# Patient Record
Sex: Female | Born: 1960
Health system: Southern US, Community
[De-identification: ages and names within clinical notes are randomized; demographics above are authoritative.]

## PROBLEM LIST (undated history)

## (undated) DIAGNOSIS — N179 Acute kidney failure, unspecified: Secondary | ICD-10-CM

## (undated) DIAGNOSIS — E7800 Pure hypercholesterolemia, unspecified: Secondary | ICD-10-CM

## (undated) DIAGNOSIS — I10 Essential (primary) hypertension: Secondary | ICD-10-CM

## (undated) DIAGNOSIS — M1990 Unspecified osteoarthritis, unspecified site: Secondary | ICD-10-CM

## (undated) DIAGNOSIS — G009 Bacterial meningitis, unspecified: Secondary | ICD-10-CM

## (undated) HISTORY — DX: Morbid (severe) obesity due to excess calories: E66.01

## (undated) HISTORY — PX: NO PAST SURGERIES: SHX2092

## (undated) HISTORY — DX: Unspecified osteoarthritis, unspecified site: M19.90

## (undated) HISTORY — PX: COLONOSCOPY: SHX174

## (undated) HISTORY — DX: Essential (primary) hypertension: I10

---
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
## 2001-02-05 ENCOUNTER — Other Ambulatory Visit: Admission: RE | Admit: 2001-02-05 | Discharge: 2001-02-05 | Payer: Self-pay | Admitting: Family Medicine

## 2001-02-13 ENCOUNTER — Ambulatory Visit (HOSPITAL_COMMUNITY): Admission: RE | Admit: 2001-02-13 | Discharge: 2001-02-13 | Payer: Self-pay | Admitting: Family Medicine

## 2001-02-13 ENCOUNTER — Encounter: Payer: Self-pay | Admitting: Family Medicine

## 2007-06-29 ENCOUNTER — Ambulatory Visit (HOSPITAL_COMMUNITY): Admission: RE | Admit: 2007-06-29 | Discharge: 2007-06-29 | Payer: Self-pay | Admitting: Internal Medicine

## 2007-11-19 ENCOUNTER — Ambulatory Visit (HOSPITAL_COMMUNITY): Admission: RE | Admit: 2007-11-19 | Discharge: 2007-11-19 | Payer: Self-pay | Admitting: Internal Medicine

## 2007-12-07 ENCOUNTER — Other Ambulatory Visit: Admission: RE | Admit: 2007-12-07 | Discharge: 2007-12-07 | Payer: Self-pay | Admitting: Obstetrics and Gynecology

## 2008-05-25 ENCOUNTER — Emergency Department (HOSPITAL_COMMUNITY): Admission: EM | Admit: 2008-05-25 | Discharge: 2008-05-25 | Payer: Self-pay | Admitting: Emergency Medicine

## 2009-09-18 ENCOUNTER — Emergency Department (HOSPITAL_COMMUNITY): Admission: EM | Admit: 2009-09-18 | Discharge: 2009-09-18 | Payer: Self-pay | Admitting: Emergency Medicine

## 2010-01-29 ENCOUNTER — Emergency Department (HOSPITAL_COMMUNITY): Admission: EM | Admit: 2010-01-29 | Discharge: 2010-01-29 | Payer: Self-pay | Admitting: Emergency Medicine

## 2010-12-11 LAB — RAPID STREP SCREEN (MED CTR MEBANE ONLY): Streptococcus, Group A Screen (Direct): NEGATIVE

## 2011-06-26 LAB — URINALYSIS, ROUTINE W REFLEX MICROSCOPIC
Nitrite: NEGATIVE
Protein, ur: NEGATIVE
Urobilinogen, UA: 0.2

## 2011-06-26 LAB — URINE MICROSCOPIC-ADD ON

## 2013-03-02 ENCOUNTER — Ambulatory Visit (INDEPENDENT_AMBULATORY_CARE_PROVIDER_SITE_OTHER): Payer: Medicaid Other | Admitting: Advanced Practice Midwife

## 2013-03-02 ENCOUNTER — Encounter: Payer: Self-pay | Admitting: Advanced Practice Midwife

## 2013-03-02 VITALS — BP 120/88 | Ht 65.0 in | Wt 252.0 lb

## 2013-03-02 DIAGNOSIS — Z3049 Encounter for surveillance of other contraceptives: Secondary | ICD-10-CM

## 2013-03-02 DIAGNOSIS — Z3200 Encounter for pregnancy test, result unknown: Secondary | ICD-10-CM

## 2013-03-02 DIAGNOSIS — Z30432 Encounter for removal of intrauterine contraceptive device: Secondary | ICD-10-CM

## 2013-03-02 NOTE — Patient Instructions (Signed)
Follow up in 6 weeks for [facility] level to see if you have completed menopause.  Use condoms in the meantime.

## 2013-03-02 NOTE — Progress Notes (Signed)
Here for IUD removal.  She had the Mirena IUD placed May 2009 and would like it removed . Her plans for future contraception are to hope that she has gone through menopause.  She will use condoms for 6 weeks then check a FSH level.  She is aware that she should have a withdrwal bleed regardless of whether she has gone through menopause or not.  She has been amenorreic for 5 years  Review of Systems   Constitutional: Negative for fever and chills Eyes: Negative for visual disturbances Respiratory: Negative for shortness of breath, dyspnea Cardiovascular: Negative for chest pain or palpitations  Gastrointestinal: Negative for vomiting, diarrhea and constipation Genitourinary: Negative for dysuria and urgency Musculoskeletal: Negative for back pain, joint pain, myalgias  Neurological: Negative for dizziness and headaches    A graves speculum was placed, and the strings were visible.  They were grasped with a curved Tresa Endo and the IUD easily removed.  Pt given IUD removal f/u instructions.

## 2013-04-13 ENCOUNTER — Ambulatory Visit: Payer: Self-pay | Admitting: Advanced Practice Midwife

## 2013-04-13 ENCOUNTER — Other Ambulatory Visit: Payer: Medicaid Other

## 2013-04-13 ENCOUNTER — Other Ambulatory Visit: Payer: Self-pay

## 2013-04-13 DIAGNOSIS — N912 Amenorrhea, unspecified: Secondary | ICD-10-CM

## 2013-04-16 ENCOUNTER — Telehealth: Payer: Self-pay | Admitting: Adult Health

## 2013-04-16 NOTE — Telephone Encounter (Signed)
Spoke with pt. FSH was 23.1. Per Drenda Freeze, not postmenopausal. Pt was planning to reinsert IUD but thinks her body feels better with IUD out. Advised pt to schedule an appt to discuss. Pt wants to check work schedule and call us back. JSY

## 2013-10-29 ENCOUNTER — Emergency Department (HOSPITAL_COMMUNITY)
Admission: EM | Admit: 2013-10-29 | Discharge: 2013-10-29 | Disposition: A | Discharge status: Home / Self Care | Payer: BC Managed Care – PPO | Attending: Emergency Medicine | Admitting: Emergency Medicine

## 2013-10-29 ENCOUNTER — Encounter (HOSPITAL_COMMUNITY): Payer: Self-pay | Admitting: Emergency Medicine

## 2013-10-29 ENCOUNTER — Emergency Department (HOSPITAL_COMMUNITY): Payer: BC Managed Care – PPO

## 2013-10-29 DIAGNOSIS — Z79899 Other long term (current) drug therapy: Secondary | ICD-10-CM | POA: Insufficient documentation

## 2013-10-29 DIAGNOSIS — R1110 Vomiting, unspecified: Secondary | ICD-10-CM

## 2013-10-29 DIAGNOSIS — R519 Headache, unspecified: Secondary | ICD-10-CM

## 2013-10-29 DIAGNOSIS — R630 Anorexia: Secondary | ICD-10-CM | POA: Insufficient documentation

## 2013-10-29 DIAGNOSIS — R509 Fever, unspecified: Secondary | ICD-10-CM | POA: Insufficient documentation

## 2013-10-29 DIAGNOSIS — R51 Headache: Secondary | ICD-10-CM | POA: Insufficient documentation

## 2013-10-29 DIAGNOSIS — IMO0002 Reserved for concepts with insufficient information to code with codable children: Secondary | ICD-10-CM | POA: Insufficient documentation

## 2013-10-29 DIAGNOSIS — I10 Essential (primary) hypertension: Secondary | ICD-10-CM | POA: Insufficient documentation

## 2013-10-29 DIAGNOSIS — J3489 Other specified disorders of nose and nasal sinuses: Secondary | ICD-10-CM | POA: Insufficient documentation

## 2013-10-29 DIAGNOSIS — R42 Dizziness and giddiness: Secondary | ICD-10-CM | POA: Insufficient documentation

## 2013-10-29 MED ORDER — ONDANSETRON 4 MG PO TBDP: 4.0000 mg | ORAL_TABLET | Freq: Three times a day (TID) | ORAL | Status: DC | PRN

## 2013-10-29 MED ORDER — HYDROCODONE-ACETAMINOPHEN 5-325 MG PO TABS
1.0000 | ORAL_TABLET | Freq: Once | ORAL | Status: AC
  Administered 2013-10-29: 1 via ORAL
  Filled 2013-10-29: qty 1

## 2013-10-29 MED ORDER — ONDANSETRON 4 MG PO TBDP
4.0000 mg | ORAL_TABLET | Freq: Once | ORAL | Status: AC
  Administered 2013-10-29: 4 mg via ORAL
  Filled 2013-10-29: qty 1

## 2013-10-29 MED ORDER — HYDROCODONE-ACETAMINOPHEN 5-325 MG PO TABS: 1.0000 | ORAL_TABLET | Freq: Four times a day (QID) | ORAL | Status: DC | PRN

## 2013-10-29 NOTE — ED Notes (Signed)
Patient reports congestion and pressure in head. Reports dizziness and feels lightheaded with ambulation.

## 2013-10-29 NOTE — ED Provider Notes (Signed)
CSN: [id number]     Arrival date & time 10/29/13  5859 History  This chart was scribed for Mervin Kung, MD by Ludger Nutting, ED Scribe. This patient was seen in room APA05/APA05 and the patient's care was started 8:28 PM.    Chief Complaint  Patient presents with  . Nasal Congestion  . Dizziness    The history is provided by the patient. No language interpreter was used.    HPI Comments: Alison Elliott is a 53 y.o. female who presents to the Emergency Department complaining of constant, gradually worsening headache and facial pressure that began 2 days ago. She also reports having decreased appetite, subjective fever, chills, and a few episodes of vomiting. She also reports having lightheadedness when leaning forward. She was seen at [facility] center yesterday and was given a nasal spray and coricidin. She denies rhinorrhea, sore throat, diarrhea.   Past Medical History  Diagnosis Date  . Hypertension    Past Surgical History  Procedure Laterality Date  . No past surgeries     Family History  Problem Relation Age of Onset  . Cancer Father     lung  . Emphysema Father   . Hypertension Mother   . Diabetes Mother   . Dementia Mother   . Arthritis Mother    History  Substance Use Topics  . Smoking status: Never Smoker   . Smokeless tobacco: Not on file  . Alcohol Use: No   OB History   Grav Para Term Preterm Abortions TAB SAB Ect Mult Living   1 1             Review of Systems  Constitutional: Positive for fever, chills and appetite change (decreased).  HENT: Negative for rhinorrhea and sore throat.   Eyes: Negative for visual disturbance.  Respiratory: Negative for cough and shortness of breath.   Cardiovascular: Negative for chest pain and leg swelling.  Gastrointestinal: Positive for vomiting. Negative for abdominal pain and diarrhea.  Genitourinary: Negative for dysuria.  Musculoskeletal: Negative for back pain and neck pain.  Skin: Negative for  rash.  Neurological: Positive for light-headedness and headaches. Negative for dizziness.  Hematological: Does not bruise/bleed easily.  Psychiatric/Behavioral: Negative for confusion.    Allergies  Review of patient's allergies indicates no known allergies.  Home Medications   Current Outpatient Rx  Name  Route  Sig  Dispense  Refill  . DM-APAP-CPM (CORICIDIN HBP FLU) 15-500-2 MG TABS   Oral   Take 2 tablets by mouth every 6 (six) hours as needed. Cold/flu         . fluticasone (FLONASE) 50 MCG/ACT nasal spray   Each Nare   Place 1 spray into both nostrils 2 (two) times daily.         Marland Kitchen lisinopril-hydrochlorothiazide (PRINZIDE,ZESTORETIC) 10-12.5 MG per tablet   Oral   Take 1 tablet by mouth daily.         Marland Kitchen HYDROcodone-acetaminophen (NORCO/VICODIN) 5-325 MG per tablet   Oral   Take 1-2 tablets by mouth every 6 (six) hours as needed for moderate pain.   14 tablet   0   . ondansetron (ZOFRAN ODT) 4 MG disintegrating tablet   Oral   Take 1 tablet (4 mg total) by mouth every 8 (eight) hours as needed.   12 tablet   1    BP 155/97  Pulse 116  Temp(Src) 98.9 F (37.2 C) (Oral)  Resp 20  Ht 5\' 3"  (1.6 m)  Wt  248 lb (112.492 kg)  BMI 43.94 kg/m2  SpO2 100%  LMP 10/20/2013 Physical Exam  Nursing note and vitals reviewed. Constitutional: She is oriented to person, place, and time. She appears well-developed and well-nourished.  HENT:  Head: Normocephalic and atraumatic.  Cardiovascular: Normal rate, regular rhythm and normal heart sounds.   Pulmonary/Chest: Effort normal and breath sounds normal. No respiratory distress. She has no wheezes. She has no rales. She exhibits no tenderness.  Abdominal: Soft. Bowel sounds are normal. She exhibits no distension. There is no tenderness. There is no rebound and no guarding.  Musculoskeletal: She exhibits no edema.  Neurological: She is alert and oriented to person, place, and time.  Skin: Skin is warm and dry.   Psychiatric: She has a normal mood and affect.    ED Course  Procedures (including critical care time)  DIAGNOSTIC STUDIES: Oxygen Saturation is 100% on RA, normal by my interpretation.    COORDINATION OF CARE: 8:32 PM Discussed treatment plan with pt at bedside and pt agreed to plan.   Labs Review Labs Reviewed - No data to display Imaging Review Ct Head Wo Contrast  10/29/2013   CLINICAL DATA:  53 year old female with congestion, pressure, dizziness, lightheaded, altered mental status. Initial encounter.  EXAM: CT HEAD WITHOUT CONTRAST  CT MAXILLOFACIAL WITHOUT CONTRAST  TECHNIQUE: Multidetector CT imaging of the head and maxillofacial structures were performed using the standard protocol without intravenous contrast. Multiplanar CT image reconstructions of the maxillofacial structures were also generated.  COMPARISON:  None.  FINDINGS: CT HEAD FINDINGS  Mastoids are clear.  No scalp hematoma.  Calvarium intact.  Cerebral volume is normal. Mild patchy cerebral white matter hypodensity, mostly periatrial in location. No suspicious intracranial vascular hyperdensity. No midline shift, ventriculomegaly, mass effect, evidence of mass lesion, intracranial hemorrhage or evidence of cortically based acute infarction. Gray-white matter differentiation is within normal limits throughout the brain.  CT MAXILLOFACIAL FINDINGS  Dysconjugate gaze. Otherwise orbits soft tissues appear within normal limits.  Mandible intact. Only minimal paranasal sinus mucosal thickening. Tympanic cavities are clear. Petrous apex air cells are clear, more numerous on the left. No acute facial bone fracture. Rightward superior and leftward inferior nasal septal deviation.  Visualized nasopharynx, oropharynx, hypopharynx, larynx, sublingual space, submandibular glands, parotid glands, parapharyngeal spaces and retropharyngeal space are within normal limits.  Visible cervical spine remarkable for chronic disc and endplate  degeneration at C4-C5.  IMPRESSION: 1. No acute intracranial abnormality. Mild for age nonspecific white matter changes. 2. Minimal paranasal sinus mucosal thickening. No acute findings identified in the face.   Electronically Signed   By: Lars Pinks M.D.   On: 10/29/2013 21:11   Ct Maxillofacial Wo Cm  10/29/2013   CLINICAL DATA:  53 year old female with congestion, pressure, dizziness, lightheaded, altered mental status. Initial encounter.  EXAM: CT HEAD WITHOUT CONTRAST  CT MAXILLOFACIAL WITHOUT CONTRAST  TECHNIQUE: Multidetector CT imaging of the head and maxillofacial structures were performed using the standard protocol without intravenous contrast. Multiplanar CT image reconstructions of the maxillofacial structures were also generated.  COMPARISON:  None.  FINDINGS: CT HEAD FINDINGS  Mastoids are clear.  No scalp hematoma.  Calvarium intact.  Cerebral volume is normal. Mild patchy cerebral white matter hypodensity, mostly periatrial in location. No suspicious intracranial vascular hyperdensity. No midline shift, ventriculomegaly, mass effect, evidence of mass lesion, intracranial hemorrhage or evidence of cortically based acute infarction. Gray-white matter differentiation is within normal limits throughout the brain.  CT MAXILLOFACIAL FINDINGS  Dysconjugate gaze. Otherwise orbits  soft tissues appear within normal limits.  Mandible intact. Only minimal paranasal sinus mucosal thickening. Tympanic cavities are clear. Petrous apex air cells are clear, more numerous on the left. No acute facial bone fracture. Rightward superior and leftward inferior nasal septal deviation.  Visualized nasopharynx, oropharynx, hypopharynx, larynx, sublingual space, submandibular glands, parotid glands, parapharyngeal spaces and retropharyngeal space are within normal limits.  Visible cervical spine remarkable for chronic disc and endplate degeneration at C4-C5.  IMPRESSION: 1. No acute intracranial abnormality. Mild for age  nonspecific white matter changes. 2. Minimal paranasal sinus mucosal thickening. No acute findings identified in the face.   Electronically Signed   By: Lars Pinks M.D.   On: 10/29/2013 21:11    EKG Interpretation   None       MDM   1. Vomiting   2. Dizziness   3. Headache    Patient clinically seemed as if she may have had sinusitis. But head CT maxillofacial CT negative for that. Suspect with the dizziness feeling lightheaded some nausea and vomiting and headache and this may just be a viral illness. No other symptoms acutely. Patient is nontoxic no acute distress. No fever. Be treated with pain medicine and antinausea medicine. Patient will followup with her doctor if symptoms persist will get an MRI of the brain. Patient will return for any newer worse symptoms.  I personally performed the services described in this documentation, which was scribed in my presence. The recorded information has been reviewed and is accurate.    Mervin Kung, MD 10/29/13 2141

## 2013-10-29 NOTE — Discharge Instructions (Signed)
Suspect a viral illness as the cause of your symptoms. Head CT and sinus CT was negative. Take pain medication and antinausea medicine as directed. Symptoms persist MRI of the brain will be needed. Return for any newer worse symptoms. Plan to make an appointment with Dr. Legrand Rams sometime in the next few days. Work note provided.

## 2013-10-31 ENCOUNTER — Encounter (HOSPITAL_COMMUNITY): Payer: Self-pay | Admitting: Emergency Medicine

## 2013-10-31 ENCOUNTER — Emergency Department (HOSPITAL_COMMUNITY): Payer: BC Managed Care – PPO

## 2013-10-31 ENCOUNTER — Inpatient Hospital Stay (HOSPITAL_COMMUNITY)
Admission: EM | Admit: 2013-10-31 | Discharge: 2013-11-09 | DRG: 094 | Disposition: A | Discharge status: Home / Self Care | Payer: BC Managed Care – PPO | Attending: Internal Medicine | Admitting: Internal Medicine

## 2013-10-31 ENCOUNTER — Inpatient Hospital Stay (HOSPITAL_COMMUNITY): Payer: BC Managed Care – PPO

## 2013-10-31 DIAGNOSIS — N19 Unspecified kidney failure: Secondary | ICD-10-CM

## 2013-10-31 DIAGNOSIS — G009 Bacterial meningitis, unspecified: Principal | ICD-10-CM | POA: Diagnosis present

## 2013-10-31 DIAGNOSIS — E861 Hypovolemia: Secondary | ICD-10-CM | POA: Diagnosis present

## 2013-10-31 DIAGNOSIS — R51 Headache: Secondary | ICD-10-CM

## 2013-10-31 DIAGNOSIS — N179 Acute kidney failure, unspecified: Secondary | ICD-10-CM

## 2013-10-31 DIAGNOSIS — R6510 Systemic inflammatory response syndrome (SIRS) of non-infectious origin without acute organ dysfunction: Secondary | ICD-10-CM

## 2013-10-31 DIAGNOSIS — I10 Essential (primary) hypertension: Secondary | ICD-10-CM | POA: Diagnosis present

## 2013-10-31 DIAGNOSIS — R519 Headache, unspecified: Secondary | ICD-10-CM

## 2013-10-31 DIAGNOSIS — I959 Hypotension, unspecified: Secondary | ICD-10-CM

## 2013-10-31 DIAGNOSIS — G9340 Encephalopathy, unspecified: Secondary | ICD-10-CM | POA: Diagnosis present

## 2013-10-31 DIAGNOSIS — Z8249 Family history of ischemic heart disease and other diseases of the circulatory system: Secondary | ICD-10-CM

## 2013-10-31 DIAGNOSIS — R4182 Altered mental status, unspecified: Secondary | ICD-10-CM

## 2013-10-31 DIAGNOSIS — E860 Dehydration: Secondary | ICD-10-CM | POA: Diagnosis present

## 2013-10-31 DIAGNOSIS — E669 Obesity, unspecified: Secondary | ICD-10-CM | POA: Diagnosis present

## 2013-10-31 DIAGNOSIS — R509 Fever, unspecified: Secondary | ICD-10-CM

## 2013-10-31 DIAGNOSIS — Z833 Family history of diabetes mellitus: Secondary | ICD-10-CM

## 2013-10-31 HISTORY — DX: Acute kidney failure, unspecified: N17.9

## 2013-10-31 LAB — BASIC METABOLIC PANEL
BUN: 27 mg/dL — ABNORMAL HIGH (ref 6–23)
CO2: 23 mEq/L (ref 19–32)
Calcium: 9.2 mg/dL (ref 8.4–10.5)
Chloride: 89 mEq/L — ABNORMAL LOW (ref 96–112)
Creatinine, Ser: 3.19 mg/dL — ABNORMAL HIGH (ref 0.50–1.10)
GFR calc Af Amer: 18 mL/min — ABNORMAL LOW (ref >90)
GFR calc non Af Amer: 16 mL/min — ABNORMAL LOW (ref >90)
Glucose, Bld: 116 mg/dL — ABNORMAL HIGH (ref 70–99)
Potassium: 3.3 mEq/L — ABNORMAL LOW (ref 3.7–5.3)
Sodium: 132 mEq/L — ABNORMAL LOW (ref 137–147)

## 2013-10-31 LAB — HEPATIC FUNCTION PANEL
ALBUMIN: 3.6 g/dL (ref 3.5–5.2)
ALT: 17 U/L (ref 0–35)
AST: 22 U/L (ref 0–37)
Alkaline Phosphatase: 104 U/L (ref 39–117)
Bilirubin, Direct: 0.5 mg/dL — ABNORMAL HIGH (ref 0.0–0.3)
Indirect Bilirubin: 1.6 mg/dL — ABNORMAL HIGH (ref 0.3–0.9)
Total Bilirubin: 2.1 mg/dL — ABNORMAL HIGH (ref 0.3–1.2)
Total Protein: 8 g/dL (ref 6.0–8.3)

## 2013-10-31 LAB — URINALYSIS, ROUTINE W REFLEX MICROSCOPIC
Glucose, UA: NEGATIVE mg/dL
Hgb urine dipstick: NEGATIVE
Ketones, ur: NEGATIVE mg/dL
Leukocytes, UA: NEGATIVE
Nitrite: NEGATIVE
Protein, ur: NEGATIVE mg/dL
Specific Gravity, Urine: 1.02 (ref 1.005–1.030)
Urobilinogen, UA: 2 mg/dL — ABNORMAL HIGH (ref 0.0–1.0)
pH: 6 (ref 5.0–8.0)

## 2013-10-31 LAB — CBC WITH DIFFERENTIAL/PLATELET
Basophils Absolute: 0 10*3/uL (ref 0.0–0.1)
Basophils Relative: 0 % (ref 0–1)
Eosinophils Absolute: 0.1 10*3/uL (ref 0.0–0.7)
Eosinophils Relative: 1 % (ref 0–5)
HCT: 43.9 % (ref 36.0–46.0)
Hemoglobin: 14.4 g/dL (ref 12.0–15.0)
Lymphocytes Relative: 40 % (ref 12–46)
Lymphs Abs: 4.6 10*3/uL — ABNORMAL HIGH (ref 0.7–4.0)
MCH: 27.3 pg (ref 26.0–34.0)
MCHC: 32.8 g/dL (ref 30.0–36.0)
MCV: 83.3 fL (ref 78.0–100.0)
Monocytes Absolute: 1.2 10*3/uL — ABNORMAL HIGH (ref 0.1–1.0)
Monocytes Relative: 10 % (ref 3–12)
Neutro Abs: 5.7 10*3/uL (ref 1.7–7.7)
Neutrophils Relative %: 49 % (ref 43–77)
Platelets: 310 10*3/uL (ref 150–400)
RBC: 5.27 MIL/uL — ABNORMAL HIGH (ref 3.87–5.11)
RDW: 14.2 % (ref 11.5–15.5)
WBC: 11.5 10*3/uL — ABNORMAL HIGH (ref 4.0–10.5)

## 2013-10-31 LAB — RAPID URINE DRUG SCREEN, HOSP PERFORMED
Amphetamines: NOT DETECTED
BARBITURATES: NOT DETECTED
Benzodiazepines: NOT DETECTED
Cocaine: NOT DETECTED
Opiates: POSITIVE — AB
Tetrahydrocannabinol: NOT DETECTED

## 2013-10-31 LAB — INFLUENZA PANEL BY PCR (TYPE A & B)
H1N1FLUPCR: NOT DETECTED
Influenza A By PCR: NEGATIVE
Influenza B By PCR: NEGATIVE

## 2013-10-31 LAB — CG4 I-STAT (LACTIC ACID): Lactic Acid, Venous: 1.74 mmol/L (ref 0.5–2.2)

## 2013-10-31 LAB — GLUCOSE, CAPILLARY: Glucose-Capillary: 116 mg/dL — ABNORMAL HIGH (ref 70–99)

## 2013-10-31 LAB — LACTIC ACID, PLASMA: Lactic Acid, Venous: 1.5 mmol/L (ref 0.5–2.2)

## 2013-10-31 MED ORDER — DEXTROSE 5 % IV SOLN
2.0000 g | INTRAVENOUS | Status: DC
  Filled 2013-10-31: qty 2

## 2013-10-31 MED ORDER — SODIUM CHLORIDE 0.9 % IV BOLUS (SEPSIS)
1000.0000 mL | Freq: Once | INTRAVENOUS | Status: AC
  Administered 2013-10-31: 1000 mL via INTRAVENOUS

## 2013-10-31 MED ORDER — SODIUM CHLORIDE 0.9 % IV BOLUS (SEPSIS): 1000.0000 mL | Freq: Once | INTRAVENOUS | Status: DC

## 2013-10-31 MED ORDER — DEXTROSE 5 % IV SOLN
INTRAVENOUS | Status: AC
  Filled 2013-10-31: qty 2

## 2013-10-31 MED ORDER — SODIUM CHLORIDE 0.9 % IV SOLN
INTRAVENOUS | Status: DC
  Administered 2013-11-01 – 2013-11-06 (×11): via INTRAVENOUS

## 2013-10-31 MED ORDER — ACETAMINOPHEN 650 MG RE SUPP
650.0000 mg | Freq: Once | RECTAL | Status: AC
  Administered 2013-10-31: 650 mg via RECTAL
  Filled 2013-10-31: qty 1

## 2013-10-31 MED ORDER — VANCOMYCIN HCL IN DEXTROSE 1-5 GM/200ML-% IV SOLN
1000.0000 mg | Freq: Once | INTRAVENOUS | Status: AC
  Administered 2013-10-31: 1000 mg via INTRAVENOUS
  Filled 2013-10-31: qty 200

## 2013-10-31 MED ORDER — CHLORHEXIDINE GLUCONATE CLOTH 2 % EX PADS
6.0000 | MEDICATED_PAD | Freq: Every day | CUTANEOUS | Status: DC
  Administered 2013-10-31: 6 via TOPICAL

## 2013-10-31 MED ORDER — DEXTROSE 5 % IV SOLN
2.0000 g | Freq: Once | INTRAVENOUS | Status: AC
  Administered 2013-10-31: 2 g via INTRAVENOUS
  Filled 2013-10-31: qty 2

## 2013-10-31 MED ORDER — SODIUM CHLORIDE 0.9 % IV SOLN
INTRAVENOUS | Status: AC
  Administered 2013-11-01 (×2): via INTRAVENOUS

## 2013-10-31 MED ORDER — PIPERACILLIN-TAZOBACTAM 3.375 G IVPB 30 MIN
3.3750 g | Freq: Once | INTRAVENOUS | Status: DC
  Filled 2013-10-31: qty 50

## 2013-10-31 NOTE — ED Provider Notes (Signed)
CSN: [id number]     Arrival date & time 10/31/13  1541 History  This chart was scribed for Virgel Manifold, MD by Roxan Diesel, ED scribe.  This patient was seen in room APA10/APA10 and the patient's care was started at 3:59 PM.   Chief Complaint  Patient presents with  . Headache    The history is provided by the patient and a relative. No language interpreter was used.    HPI Comments: Alison Elliott is a 53 y.o. female who presents to the Emergency Department complaining of a persistent frontal headache that began 6 days ago, with associated "shuffling" that began 2 days ago and slurred speech that began today.  Pt saw a doctor at [facility] 4 days ago for her headache and was instructed to take some nasal spray and OTC cold medication.  Her HA persisted and she was again seen here, where she received an assuring head CT and was given hydrocodone and anti-emetics to take at home.  Pt's headache has still not resolved.  Sister also states that 2 days ago pt started "shuffling" and was complaining of feeling off-balanced when she stood up.  Today at around 3:30 PM her daughter noticed that her speech was slurred and called EMS.  Pt has also been very fatigued.  Pt denies neck pain, nausea, visual changes, urinary symptoms, vomiting, fever, or cough.  She denies recent falls.  Pt works as a [employer]ar[employer] manager.  She lives at home with her daughter.   Past Medical History  Diagnosis Date  . Hypertension     Past Surgical History  Procedure Laterality Date  . No past surgeries      Family History  Problem Relation Age of Onset  . Cancer Father     lung  . Emphysema Father   . Hypertension Mother   . Diabetes Mother   . Dementia Mother   . Arthritis Mother     History  Substance Use Topics  . Smoking status: Never Smoker   . Smokeless tobacco: Not on file  . Alcohol Use: No    OB History   Grav Para Term Preterm Abortions TAB SAB Ect Mult Living   1 1               Review of Systems  All other systems reviewed and are negative.     Allergies  Review of patient's allergies indicates no known allergies.  Home Medications   Current Outpatient Rx  Name  Route  Sig  Dispense  Refill  . DM-APAP-CPM (CORICIDIN HBP FLU) 15-500-2 MG TABS   Oral   Take 2 tablets by mouth every 6 (six) hours as needed. Cold/flu         . fluticasone (FLONASE) 50 MCG/ACT nasal spray   Each Nare   Place 1 spray into both nostrils 2 (two) times daily.         Marland Kitchen HYDROcodone-acetaminophen (NORCO/VICODIN) 5-325 MG per tablet   Oral   Take 1-2 tablets by mouth every 6 (six) hours as needed for moderate pain.   14 tablet   0   . lisinopril-hydrochlorothiazide (PRINZIDE,ZESTORETIC) 10-12.5 MG per tablet   Oral   Take 1 tablet by mouth daily.         . ondansetron (ZOFRAN ODT) 4 MG disintegrating tablet   Oral   Take 1 tablet (4 mg total) by mouth every 8 (eight) hours as needed.   12 tablet   1  BP 93/46  Pulse 122  Temp(Src) 100 F (37.8 C) (Oral)  Resp 20  Ht 5\' 3"  (1.6 m)  Wt 248 lb (112.492 kg)  BMI 43.94 kg/m2  SpO2 94%  LMP 10/20/2013  Physical Exam  Nursing note and vitals reviewed. Constitutional: She appears well-developed and well-nourished. No distress.  HENT:  Head: Normocephalic and atraumatic.  Eyes: Conjunctivae are normal. Right eye exhibits no discharge. Left eye exhibits no discharge.  Neck: Neck supple.  No nuchal rigidity  Cardiovascular: Regular rhythm and normal heart sounds.  Tachycardia present.  Exam reveals no gallop and no friction rub.   No murmur heard. Pulmonary/Chest: Effort normal and breath sounds normal. No respiratory distress.  Abdominal: Soft. She exhibits no distension. There is no tenderness.  Musculoskeletal: She exhibits no edema and no tenderness.  Neurological:  Drowsy.  Follows simple commands but falls asleep quickly.  Globally weak.  No focal motor deficit.   Skin: Skin is warm and dry.     ED Course  LUMBAR PUNCTURE Date/Time: 10/31/2013 7:00 PM Performed by: Virgel Manifold Authorized by: Virgel Manifold Consent: Verbal consent obtained. written consent obtained. Risks and benefits: risks, benefits and alternatives were discussed Consent given by: patient Required items: required blood products, implants, devices, and special equipment available Patient identity confirmed: verbally with patient, arm band and provided demographic data Time out: Immediately prior to procedure a "time out" was called to verify the correct patient, procedure, equipment, support staff and site/side marked as required. Indications: evaluation for infection and evaluation for altered mental status Anesthesia: local infiltration Local anesthetic: lidocaine 1% without epinephrine Anesthetic total: 5 ml Patient sedated: no Preparation: Patient was prepped and draped in the usual sterile fashion. Lumbar space: L3-L4 interspace Patient's position: left lateral decubitus Needle gauge: 22 Needle type: spinal needle - Quincke tip Needle length: 3.5 in Number of attempts: 2 Post-procedure: adhesive bandage applied Patient tolerance: Patient tolerated the procedure well with no immediate complications. Comments: LP attempted unsuccessfully. No apparent complications.    CRITICAL CARE Performed by: Virgel Manifold  Total critical care time: 35 minutes  Critical care time was exclusive of separately billable procedures and treating other patients. Critical care was necessary to treat or prevent imminent or life-threatening deterioration. Critical care was time spent personally by me on the following activities: development of treatment plan with patient and/or surrogate as well as nursing, discussions with consultants, evaluation of patient's response to treatment, examination of patient, obtaining history from patient or surrogate, ordering and performing treatments and interventions, ordering and  review of laboratory studies, ordering and review of radiographic studies, pulse oximetry and re-evaluation of patient's condition.    (including critical care time)    DIAGNOSTIC STUDIES: Oxygen Saturation is 94% on room air, adequate by my interpretation.    COORDINATION OF CARE: 4:06 PM-Discussed treatment plan which includes lumbar puncture, CXR, bloodwork, UA, and admission with pt's family at bedside and they agreed to plan.     Labs Review Labs Reviewed  GLUCOSE, CAPILLARY - Abnormal; Notable for the following:    Glucose-Capillary 116 (*)    All other components within normal limits    Imaging Review Ct Head Wo Contrast  10/29/2013   CLINICAL DATA:  54 year old female with congestion, pressure, dizziness, lightheaded, altered mental status. Initial encounter.  EXAM: CT HEAD WITHOUT CONTRAST  CT MAXILLOFACIAL WITHOUT CONTRAST  TECHNIQUE: Multidetector CT imaging of the head and maxillofacial structures were performed using the standard protocol without intravenous contrast. Multiplanar CT image reconstructions of  the maxillofacial structures were also generated.  COMPARISON:  None.  FINDINGS: CT HEAD FINDINGS  Mastoids are clear.  No scalp hematoma.  Calvarium intact.  Cerebral volume is normal. Mild patchy cerebral white matter hypodensity, mostly periatrial in location. No suspicious intracranial vascular hyperdensity. No midline shift, ventriculomegaly, mass effect, evidence of mass lesion, intracranial hemorrhage or evidence of cortically based acute infarction. Gray-white matter differentiation is within normal limits throughout the brain.  CT MAXILLOFACIAL FINDINGS  Dysconjugate gaze. Otherwise orbits soft tissues appear within normal limits.  Mandible intact. Only minimal paranasal sinus mucosal thickening. Tympanic cavities are clear. Petrous apex air cells are clear, more numerous on the left. No acute facial bone fracture. Rightward superior and leftward inferior nasal septal  deviation.  Visualized nasopharynx, oropharynx, hypopharynx, larynx, sublingual space, submandibular glands, parotid glands, parapharyngeal spaces and retropharyngeal space are within normal limits.  Visible cervical spine remarkable for chronic disc and endplate degeneration at C4-C5.  IMPRESSION: 1. No acute intracranial abnormality. Mild for age nonspecific white matter changes. 2. Minimal paranasal sinus mucosal thickening. No acute findings identified in the face.   Electronically Signed   By: Lars Pinks M.D.   On: 10/29/2013 21:11   Ct Maxillofacial Wo Cm  10/29/2013   CLINICAL DATA:  53 year old female with congestion, pressure, dizziness, lightheaded, altered mental status. Initial encounter.  EXAM: CT HEAD WITHOUT CONTRAST  CT MAXILLOFACIAL WITHOUT CONTRAST  TECHNIQUE: Multidetector CT imaging of the head and maxillofacial structures were performed using the standard protocol without intravenous contrast. Multiplanar CT image reconstructions of the maxillofacial structures were also generated.  COMPARISON:  None.  FINDINGS: CT HEAD FINDINGS  Mastoids are clear.  No scalp hematoma.  Calvarium intact.  Cerebral volume is normal. Mild patchy cerebral white matter hypodensity, mostly periatrial in location. No suspicious intracranial vascular hyperdensity. No midline shift, ventriculomegaly, mass effect, evidence of mass lesion, intracranial hemorrhage or evidence of cortically based acute infarction. Gray-white matter differentiation is within normal limits throughout the brain.  CT MAXILLOFACIAL FINDINGS  Dysconjugate gaze. Otherwise orbits soft tissues appear within normal limits.  Mandible intact. Only minimal paranasal sinus mucosal thickening. Tympanic cavities are clear. Petrous apex air cells are clear, more numerous on the left. No acute facial bone fracture. Rightward superior and leftward inferior nasal septal deviation.  Visualized nasopharynx, oropharynx, hypopharynx, larynx, sublingual space,  submandibular glands, parotid glands, parapharyngeal spaces and retropharyngeal space are within normal limits.  Visible cervical spine remarkable for chronic disc and endplate degeneration at C4-C5.  IMPRESSION: 1. No acute intracranial abnormality. Mild for age nonspecific white matter changes. 2. Minimal paranasal sinus mucosal thickening. No acute findings identified in the face.   Electronically Signed   By: Lars Pinks M.D.   On: 10/29/2013 21:11    EKG Interpretation   None       MDM   1. SIRS (systemic inflammatory response syndrome)   2. Hypotension   3. Renal failure   4. Headache     24:92 PM 53 year old female with decreased mental status. Tachycardic, hypotensive and febrile. IV established. IV bolus. Empiric antibiotics for sepsis. Source not completely clear at this time. Unsure if recent headaches related. Will LP. No focal motor deficit. Recent CT after onset of HA negative. I do not feel needs repeat neuro imaging at this time.  Blood work  including cultures, urinalysis, chest x-ray. Will need admit.   Mentation improving. More alert but still drowsy. No new complaints. No obvious source of infection at this point. Concern  for meningitis with recent HA. No nuchal rigidity though. LP attempted but unsuccessful. Pt is being empirically tx'd for meningitis. Delay in LP could affect cultures, but shouldn't alter cell counts to the point   I personally preformed the services scribed in my presence. The recorded information has been reviewed is accurate. Virgel Manifold, MD.    Virgel Manifold, MD 11/03/13 [phone]

## 2013-10-31 NOTE — H&P (Signed)
PCP:   FANTA,TESFAYE, MD   Chief Complaint:  headache  HPI: 53 yo female with 5 days of worsening frontal headache that started last week with uri symptoms, was having some nasal congestion.  No cough.  No fevers at that time.  Was seen in ED, treated for sinusitis but pt does not know if she was given any abx.  Went home, over the weekend the headache persisted, she started getting dizzy and continued to not feel well with general malaise.  No n/v/d.  No dysuria.  No rashes.  Has not been eating or drinking well at all.  No abd pain.  No cp.  No vision changes.  Comes back to ED with worsening symptoms, found to be in worsening renal failure and concerns for meningitis.  LP was attempted and unsuccessful.  Was hypotensive initially, improved with 3 Liters of ivf.  Pt on mult bp meds which she has been taking.  No neck pain.  Review of Systems:  Positive and negative as per HPI otherwise all other systems are negative  Past Medical History: Past Medical History  Diagnosis Date  . Hypertension    Past Surgical History  Procedure Laterality Date  . No past surgeries      Medications: Prior to Admission medications   Medication Sig Start Date End Date Taking? Authorizing Provider  DM-APAP-CPM (CORICIDIN HBP FLU) 15-500-2 MG TABS Take 2 tablets by mouth every 6 (six) hours as needed. Cold/flu   Yes Historical Provider, MD  fluticasone (FLONASE) 50 MCG/ACT nasal spray Place 1 spray into both nostrils 2 (two) times daily.   Yes Historical Provider, MD  HYDROcodone-acetaminophen (NORCO/VICODIN) 5-325 MG per tablet Take 1-2 tablets by mouth every 6 (six) hours as needed for moderate pain. 10/29/13  Yes Mervin Kung, MD  lisinopril-hydrochlorothiazide (PRINZIDE,ZESTORETIC) 10-12.5 MG per tablet Take 1 tablet by mouth daily.   Yes Historical Provider, MD  ondansetron (ZOFRAN ODT) 4 MG disintegrating tablet Take 1 tablet (4 mg total) by mouth every 8 (eight) hours as needed. 10/29/13  Yes Mervin Kung, MD    Allergies:  No Known Allergies  Social History:  reports that she has never smoked. She does not have any smokeless tobacco history on file. She reports that she does not drink alcohol or use illicit drugs.  Family History: Family History  Problem Relation Age of Onset  . Cancer Father     lung  . Emphysema Father   . Hypertension Mother   . Diabetes Mother   . Dementia Mother   . Arthritis Mother     Physical Exam: Filed Vitals:   10/31/13 1608 10/31/13 1650 10/31/13 1702 10/31/13 1816  BP: 86/47  117/53 90/56  Pulse:   105 106  Temp:  102.3 F (39.1 C)  99.6 F (37.6 C)  TempSrc:    Oral  Resp:   25 18  Height:      Weight:      SpO2:   94% 96%   General appearance: alert, cooperative and no distress Head: Normocephalic, without obvious abnormality, atraumatic Eyes: negative Nose: Nares normal. Septum midline. Mucosa normal. No drainage or sinus tenderness. Neck: no JVD, supple, symmetrical, trachea midline and thyroid not enlarged, symmetric, no tenderness/mass/nodules Lungs: clear to auscultation bilaterally Heart: regular rate and rhythm, S1, S2 normal, no murmur, click, rub or gallop Abdomen: soft, non-tender; bowel sounds normal; no masses,  no organomegaly Extremities: extremities normal, atraumatic, no cyanosis or edema Pulses: 2+ and symmetric Skin: Skin  color, texture, turgor normal. No rashes or lesions Neurologic: Grossly normal    Labs on Admission:   Recent Labs  10/31/13 1609  NA 132*  K 3.3*  CL 89*  CO2 23  GLUCOSE 116*  BUN 27*  CREATININE 3.19*  CALCIUM 9.2    Recent Labs  10/31/13 1609  WBC 11.5*  NEUTROABS 5.7  HGB 14.4  HCT 43.9  MCV 83.3  PLT 310    Radiological Exams on Admission: Dg Chest Portable 1 View  10/31/2013   CLINICAL DATA:  Fever, hypotension.  EXAM: PORTABLE CHEST - 1 VIEW  COMPARISON:  09/18/2009  FINDINGS: Heart is borderline in size. Mild vascular congestion. No confluent  opacities, effusions or edema. No acute bony abnormality.  IMPRESSION: Borderline heart size, mild vascular congestion   Electronically Signed   By: Rolm Baptise M.D.   On: 10/31/2013 16:39   Assessment/Plan  53 yo female with recent URI, worsening headache, worsening renal failure and hypotension concerning for meningitis +- sepsis   Principal Problem:   Fever- in setting of headache concerning.  Seems viral.  Will order US guided LP in am.  Give iv vanco/rocephin.  Neurology at [facility] was called for consulted and recommended staying at [facility] for w/u.  bc obtained.  cxr and ua are unrevealing as source.  Obtain neuro evaluation in am.  Active Problems:   Acute renal failure- with unknown baseline cr.  Pt denies any h/o ckd.  Repeat labs now after ivf given.  Could be from continued use of multiple bp meds in setting of dehydration.  Hold bp meds.   Headache as above   Hypotension-  i think this is more likely again from continued use of bp meds with decreased po intake/dehydration.  Better with ivf.  Monitor closely.  Sepsis also in differential.   pcp dr Rometta Emery A 10/31/2013, 8:20 PM

## 2013-10-31 NOTE — ED Notes (Addendum)
Pt c/o headache since Monday.  Saw doctor at [place] on Wednesday and instructed her to take some nasal spray and otc cold medicine.  Pt was evaluated here Friday night for headache.  Reports had CT scan and was given hydrocodone and nausea medication to take at home.  Pt still no better.  Sister reports noticed around 3:30 today that her speech is slurred.  Pt denies any weakness.  However, sister reports pt has been "shuffling" when she walks since Friday.

## 2013-10-31 NOTE — ED Notes (Signed)
Walked into room 10 and dr. Wilson Singer was performing an lumbar puncture wiith family at bedside.  Took consent form in room and signature was obtained from family.

## 2013-11-01 ENCOUNTER — Inpatient Hospital Stay (HOSPITAL_COMMUNITY): Payer: BC Managed Care – PPO

## 2013-11-01 LAB — BASIC METABOLIC PANEL
BUN: 21 mg/dL (ref 6–23)
BUN: 18 mg/dL (ref 6–23)
CHLORIDE: 99 meq/L (ref 96–112)
CO2: 23 mEq/L (ref 19–32)
CO2: 25 mEq/L (ref 19–32)
CREATININE: 1.69 mg/dL — AB (ref 0.50–1.10)
Calcium: 8.5 mg/dL (ref 8.4–10.5)
Calcium: 8.8 mg/dL (ref 8.4–10.5)
Chloride: 101 mEq/L (ref 96–112)
Creatinine, Ser: 1.5 mg/dL — ABNORMAL HIGH (ref 0.50–1.10)
GFR calc Af Amer: 45 mL/min — ABNORMAL LOW (ref >90)
GFR calc non Af Amer: 34 mL/min — ABNORMAL LOW (ref >90)
GFR calc non Af Amer: 39 mL/min — ABNORMAL LOW (ref >90)
GFR, EST AFRICAN AMERICAN: 39 mL/min — AB (ref >90)
Glucose, Bld: 101 mg/dL — ABNORMAL HIGH (ref 70–99)
Glucose, Bld: 91 mg/dL (ref 70–99)
POTASSIUM: 3.2 meq/L — AB (ref 3.7–5.3)
Potassium: 3 mEq/L — ABNORMAL LOW (ref 3.7–5.3)
Sodium: 138 mEq/L (ref 137–147)
Sodium: 141 mEq/L (ref 137–147)

## 2013-11-01 LAB — CSF CELL COUNT WITH DIFFERENTIAL
Eosinophils, CSF: 0 % (ref 0–1)
LYMPHS CSF: 65 % (ref 40–80)
Monocyte-Macrophage-Spinal Fluid: 11 % — ABNORMAL LOW (ref 15–45)
RBC Count, CSF: 23 /mm3 — ABNORMAL HIGH
Segmented Neutrophils-CSF: 24 % — ABNORMAL HIGH (ref 0–6)
TUBE #: 3
WBC, CSF: 58 /mm3 (ref 0–5)

## 2013-11-01 LAB — CBC
HCT: 40.5 % (ref 36.0–46.0)
Hemoglobin: 12.8 g/dL (ref 12.0–15.0)
MCH: 26.6 pg (ref 26.0–34.0)
MCHC: 31.6 g/dL (ref 30.0–36.0)
MCV: 84 fL (ref 78.0–100.0)
Platelets: 287 10*3/uL (ref 150–400)
RBC: 4.82 MIL/uL (ref 3.87–5.11)
RDW: 14.5 % (ref 11.5–15.5)
WBC: 8.5 10*3/uL (ref 4.0–10.5)

## 2013-11-01 LAB — PROTEIN AND GLUCOSE, CSF
Glucose, CSF: 56 mg/dL (ref 43–76)
Total  Protein, CSF: 66 mg/dL — ABNORMAL HIGH (ref 15–45)

## 2013-11-01 LAB — PROTIME-INR
INR: 1.13 (ref 0.00–1.49)
Prothrombin Time: 14.3 seconds (ref 11.6–15.2)

## 2013-11-01 LAB — MRSA PCR SCREENING: MRSA by PCR: NEGATIVE

## 2013-11-01 MED ORDER — ACETAMINOPHEN 325 MG PO TABS
650.0000 mg | ORAL_TABLET | Freq: Four times a day (QID) | ORAL | Status: DC | PRN
  Administered 2013-11-01 – 2013-11-07 (×11): 650 mg via ORAL
  Filled 2013-11-01 (×13): qty 2

## 2013-11-01 MED ORDER — DEXTROSE 5 % IV SOLN
10.0000 mg/kg | Freq: Three times a day (TID) | INTRAVENOUS | Status: DC
  Filled 2013-11-01 (×3): qty 10.5

## 2013-11-01 MED ORDER — DEXTROSE 5 % IV SOLN
10.0000 mg/kg | Freq: Three times a day (TID) | INTRAVENOUS | Status: DC
  Administered 2013-11-01 – 2013-11-02 (×2): 525 mg via INTRAVENOUS
  Filled 2013-11-01 (×3): qty 10.5

## 2013-11-01 MED ORDER — DEXTROSE 5 % IV SOLN
2.0000 g | INTRAVENOUS | Status: DC
  Filled 2013-11-01: qty 2

## 2013-11-01 MED ORDER — POTASSIUM CHLORIDE CRYS ER 20 MEQ PO TBCR
40.0000 meq | EXTENDED_RELEASE_TABLET | Freq: Every day | ORAL | Status: AC
  Administered 2013-11-01: 40 meq via ORAL
  Filled 2013-11-01: qty 2

## 2013-11-01 MED ORDER — DEXTROSE 5 % IV SOLN
2.0000 g | Freq: Two times a day (BID) | INTRAVENOUS | Status: DC
  Administered 2013-11-01 – 2013-11-09 (×18): 2 g via INTRAVENOUS
  Filled 2013-11-01 (×19): qty 2

## 2013-11-01 MED ORDER — OXYCODONE-ACETAMINOPHEN 5-325 MG PO TABS
1.0000 | ORAL_TABLET | Freq: Once | ORAL | Status: AC
  Administered 2013-11-01: 1 via ORAL
  Filled 2013-11-01: qty 1

## 2013-11-01 MED ORDER — VANCOMYCIN HCL IN DEXTROSE 1-5 GM/200ML-% IV SOLN
1000.0000 mg | Freq: Once | INTRAVENOUS | Status: AC
  Administered 2013-11-01: 1000 mg via INTRAVENOUS
  Filled 2013-11-01: qty 200

## 2013-11-01 MED ORDER — DEXTROSE 5 % IV SOLN
INTRAVENOUS | Status: AC
  Filled 2013-11-01: qty 2

## 2013-11-01 MED ORDER — VANCOMYCIN HCL IN DEXTROSE 1-5 GM/200ML-% IV SOLN
INTRAVENOUS | Status: AC
  Filled 2013-11-01: qty 200

## 2013-11-01 MED ORDER — VANCOMYCIN HCL 10 G IV SOLR
1500.0000 mg | INTRAVENOUS | Status: DC
  Administered 2013-11-01: 1500 mg via INTRAVENOUS
  Filled 2013-11-01: qty 1500

## 2013-11-01 NOTE — Progress Notes (Signed)
ANTIBIOTIC CONSULT NOTE  Pharmacy Consult for vancomycin (with Rocephin) Indication: CNS infection - R/o meningitis  No Known Allergies  Patient Measurements: Height: 5\' 3"  (160 cm) Weight: 251 lb 15.8 oz (114.3 kg) IBW/kg (Calculated) : 52.4  Vital Signs: Temp: 100.2 F (37.9 C) (02/09 0800) Temp src: Axillary (02/09 0800) BP: 116/63 mmHg (02/09 1000) Pulse Rate: 108 (02/09 1000) Intake/Output from previous day: 02/08 0701 - 02/09 0700 In: 783.3 [I.V.:533.3; IV Piggyback:250] Out: 6440 [Urine:1730] Intake/Output from this shift: Total I/O In: 400 [I.V.:400] Out: -   Labs:  Recent Labs  10/31/13 1609 11/01/13 0109 11/01/13 0512  WBC 11.5*  --  8.5  HGB 14.4  --  12.8  PLT 310  --  287  CREATININE 3.19* 1.69* 1.50*   Estimated Creatinine Clearance: 53.5 ml/min (by C-G formula based on Cr of 1.5). No results found for this basename: VANCOTROUGH, Corlis Leak, VANCORANDOM, GENTTROUGH, GENTPEAK, GENTRANDOM, TOBRATROUGH, TOBRAPEAK, TOBRARND, AMIKACINPEAK, AMIKACINTROU, AMIKACIN,  in the last 72 hours   Microbiology: Recent Results (from the past 720 hour(s))  CULTURE, BLOOD (ROUTINE X 2)     Status: None   Collection Time    10/31/13  4:08 PM      Result Value Range Status   Specimen Description BLOOD LEFT ARM   Final   Special Requests AEB 6 CC   Final   Culture NO GROWTH 1 DAY   Final   Report Status PENDING   Incomplete  CULTURE, BLOOD (ROUTINE X 2)     Status: None   Collection Time    10/31/13  4:20 PM      Result Value Range Status   Specimen Description BLOOD RIGHT HAND   Final   Special Requests BOTTLES DRAWN AEROBIC AND ANAEROBIC 8CC   Final   Culture NO GROWTH 1 DAY   Final   Report Status PENDING   Incomplete  MRSA PCR SCREENING     Status: None   Collection Time    11/01/13  3:00 AM      Result Value Range Status   MRSA by PCR NEGATIVE  NEGATIVE Final   Comment:            The GeneXpert MRSA Assay (FDA     approved for NASAL specimens     only),  is one component of a     comprehensive MRSA colonization     surveillance program. It is not     intended to diagnose MRSA     infection nor to guide or     monitor treatment for     MRSA infections.    Medical History: Past Medical History  Diagnosis Date  . Hypertension     Medications:  Scheduled:  . cefTRIAXone (ROCEPHIN)  IV  2 g Intravenous Q12H  . sodium chloride  1,000 mL Intravenous Once  . sodium chloride  1,000 mL Intravenous Once  . vancomycin  1,500 mg Intravenous Q24H   Infusions:  . sodium chloride 125 mL/hr at 11/01/13 1111  . sodium chloride 100 mL/hr at 11/01/13 1110   PRN:   Assessment: 53yo obese female with HA/CNS sx's for 5 days complicated by acute renal failure.  She has been started on empiric treatment for meningitis.  Plan LP today. She continues to spike fevers. WBC is trending down.   Renal function appears to be improving.  Vancomycin 2/8>> Rocephin 2/8>>  Goal of Therapy:  Vancomycin trough 15-20 mcg/ml  Plan:  1.  Vancomycin 1500mg  IV q24h 2.  Check Vancomycin trough at steady state 3.  Continue Rocephin 2gm IV q12h 4.  Monitor renal function, cx data, & LP results  Rielle Schlauch, Lavonia Drafts 11/01/2013,11:35 AM

## 2013-11-01 NOTE — Progress Notes (Signed)
UR chart review completed.  

## 2013-11-01 NOTE — Procedures (Signed)
Preprocedure Dx: Headache, fever Postprocedure Dx: Headache, fever Procedure:  Fluoroscopically guided lumbar puncture Radiologist:  Thornton Papas Anesthesia:  1.5 ml of 1% lidocaine Specimen:  9 ml CSF, clear colorless EBL:   None Opening pressure: 3 cm E1R Complications: None

## 2013-11-01 NOTE — Progress Notes (Signed)
Subjective: Patient was admitted las night due to fever and headache. She is on combination antibiotics for suspicion of meningitis.  Objective: Vital signs in last 24 hours: Temp:  [98.8 F (37.1 C)-102.3 F (39.1 C)] 100.2 F (37.9 C) (02/09 0800) Pulse Rate:  [97-122] 114 (02/09 0700) Resp:  [13-34] 30 (02/09 0800) BP: (86-154)/(46-90) 99/54 mmHg (02/09 0800) SpO2:  [94 %-100 %] 100 % (02/09 0700) Weight:  [112.492 kg (248 lb)-114.3 kg (251 lb 15.8 oz)] 114.3 kg (251 lb 15.8 oz) (02/08 2300) Weight change:  Last BM Date: 10/29/13  Intake/Output from previous day: 02/08 0701 - 02/09 0700 In: 783.3 [I.V.:533.3; IV Piggyback:250] Out: 3818 [Urine:1730]  PHYSICAL EXAM General appearance: alert and no distress Resp: clear to auscultation bilaterally Cardio: S1, S2 normal GI: soft, non-tender; bowel sounds normal; no masses,  no organomegaly Extremities: extremities normal, atraumatic, no cyanosis or edema  Lab Results:    @labtest @ [facility] No results found for this basename: PHART, PCO2, PO2ART, TCO2, HCO3,  in the last 72 hours CULTURES Recent Results (from the past 240 hour(s))  CULTURE, BLOOD (ROUTINE X 2)     Status: None   Collection Time    10/31/13  4:08 PM      Result Value Range Status   Specimen Description BLOOD LEFT ARM   Final   Special Requests AEB 6 CC   Final   Culture PENDING   Incomplete   Report Status PENDING   Incomplete  MRSA PCR SCREENING     Status: None   Collection Time    11/01/13  3:00 AM      Result Value Range Status   MRSA by PCR NEGATIVE  NEGATIVE Final   Comment:            The GeneXpert MRSA Assay (FDA     approved for NASAL specimens     only), is one component of a     comprehensive MRSA colonization     surveillance program. It is not     intended to diagnose MRSA     infection nor to guide or     monitor treatment for     MRSA infections.   Studies/Results: Ct Head Wo Contrast  10/31/2013   CLINICAL DATA:  Headaches since  Monday.  History of hypertension.  EXAM: CT HEAD WITHOUT CONTRAST  TECHNIQUE: Contiguous axial images were obtained from the base of the skull through the vertex without intravenous contrast.  COMPARISON:  CT HEAD W/O CM dated 10/29/2013; CT MAXILLOFACIAL W/O CM SINUS dated 10/29/2013  FINDINGS: No mass lesion, mass effect, midline shift, hydrocephalus, hemorrhage. No territorial ischemia or acute infarction. Periventricular white matter disease is present, likely chronic ischemic although nonspecific. This appears unchanged compared to 10/29/2013. Unchanged mild paranasal sinus disease with mucosal thickening. Calvarium intact.  IMPRESSION: No acute intracranial abnormality. Unchanged periventricular white matter disease, nonspecific but likely chronic ischemic.   Electronically Signed   By: Dereck Ligas M.D.   On: 10/31/2013 22:51   Dg Chest Portable 1 View  10/31/2013   CLINICAL DATA:  Fever, hypotension.  EXAM: PORTABLE CHEST - 1 VIEW  COMPARISON:  09/18/2009  FINDINGS: Heart is borderline in size. Mild vascular congestion. No confluent opacities, effusions or edema. No acute bony abnormality.  IMPRESSION: Borderline heart size, mild vascular congestion   Electronically Signed   By: Rolm Baptise M.D.   On: 10/31/2013 16:39    Medications: I have reviewed the patient's current medications.  Assesment: Principal Problem:  Fever Active Problems:   Acute renal failure   Headache   Hypotension   Altered mental state    Plan: Will continue Iv antibiotics LP to be done by radiology Neurology consult pending.     LOS: 1 day   Alison Elliott 11/01/2013, 8:23 AM

## 2013-11-01 NOTE — Progress Notes (Signed)
Nutrition Brief Note  Patient identified on the Malnutrition Screening Tool (MST) Report  Wt Readings from Last 15 Encounters:  10/31/13 251 lb 15.8 oz (114.3 kg)  10/29/13 248 lb (112.492 kg)  03/02/13 252 lb (114.306 kg)    Body mass index is 44.65 kg/(m^2). Patient meets criteria for extreme obesity, class III based on current BMI.   Current diet order is NPO, patient is consuming approximately n/a% of meals at this time. Labs and medications reviewed.   No nutrition interventions warranted at this time. If nutrition issues arise, please consult RD.   Adele Milson A. Jimmye Norman, RD, LDN Pager: [phone]

## 2013-11-01 NOTE — Progress Notes (Signed)
  CSF: WBC 58 the differential is pending, RBC 23, glucose 56 and protein 66.  The patient's CSF analysis is reviewed. The findings are suggestive of herpes encephalitis. Consequently, the patient will be started on IV acyclovir. MRI and EEG will also be obtained. She is on vancomycin and ceftriaxone. These will be continued. Follow-up labs and cultures.

## 2013-11-01 NOTE — Consult Note (Signed)
[place] A. Merlene Laughter, MD     [email or web address]          Alison Elliott is an 53 y.o. female.   ASSESSMENT/PLAN: The presentation of the patient's symptoms ( multiple mast, slow speech fever and headaches )are worrisome for CNS infection. The patient could also have some type of systemic infection/sepsis.  The patient had a spinal tap pending. FU blood culture. She is on meningitic doses of Rocephin. Additional labs. We will continue to follow the patient as she progresses. Further recommendations depends on the initial workup and how she progresses.  The patient is 53 year old black female who presents with about a one-week history of bifrontal headaches and is not feeling well. She may have had a low-grade fever at home particularly has been febrile in the hospital over the last couple days. She spiked to 102 twice. She has been placed on meningitic doses of Rocephin. She appears to have been in septic shock with marked tachycardia and hypotension. The patient workup is also significant for acute renal failure creatinine of 3. The patient she was given the pain medications as her primary care facility but this did not afford the patient was significant headache relief. She simply presented back to the hospital here for further evaluation. Imaging of the head has been negative. The sister is concerned because there is a family history of aneurysm. The patient herself does not report a baseline history of headache. The patient reportedly has been less responsive and drowsy than usual. She has had some alteration of mentation along with slurring of her speech. There are no focal numbness or weakness. There is systems is otherwise negative although somewhat limited because she is drowsy. She has been given Percocet for headaches although she has generally been somewhat unresponsive even before administration of pain medications.      GENERAL:  This is an obese lady in no acute  distress. She is drowsy and sleepy throughout the evaluation.  HEENT: Neck is supple.  ABDOMEN: soft  EXTREMITIES: No edema   BACK: Unremarkable.  SKIN: Normal by inspection.    MENTAL STATUS: Sleepy but easily arousable to verbal commands. She follows commands although sometimes she required repeated prompting suggesting some impairment of comprehension. She falls back asleep without stimulation. Speech is normal.  CRANIAL NERVES: Pupils are equal, round and reactive to light and accommodation; extra ocular movements are full, there is no significant nystagmus; visual fields are full; upper and lower facial muscles are normal in strength and symmetric, there is no flattening of the nasolabial folds; tongue is midline; uvula is midline; shoulder elevation is normal. Attempted funduscopic examination shows that this appears to be flat but very limited due to lack of cooperation.  MOTOR: Normal tone, bulk and strength; no pronator drift.  COORDINATION: Left finger to nose is normal, right finger to nose is normal, No rest tremor; no intention tremor; no postural tremor; no bradykinesia.  REFLEXES: Deep tendon reflexes are symmetrical and normal except at the knees where they are quite diminished but normal at the ankles. Plantar responses are flexor bilaterally.   SENSATION: Normal to light touch.   Past Medical History  Diagnosis Date  . Hypertension     Past Surgical History  Procedure Laterality Date  . No past surgeries      Family History  Problem Relation Age of Onset  . Cancer Father     lung  . Emphysema Father   . Hypertension Mother   . Diabetes  Mother   . Dementia Mother   . Arthritis Mother     Social History:  reports that she has never smoked. She does not have any smokeless tobacco history on file. She reports that she does not drink alcohol or use illicit drugs.  Allergies: No Known Allergies  Medications: Prior to Admission medications   Medication  Sig Start Date End Date Taking? Authorizing Provider  DM-APAP-CPM (CORICIDIN HBP FLU) 15-500-2 MG TABS Take 2 tablets by mouth every 6 (six) hours as needed. Cold/flu   Yes Historical Provider, MD  fluticasone (FLONASE) 50 MCG/ACT nasal spray Place 1 spray into both nostrils 2 (two) times daily.   Yes Historical Provider, MD  HYDROcodone-acetaminophen (NORCO/VICODIN) 5-325 MG per tablet Take 1-2 tablets by mouth every 6 (six) hours as needed for moderate pain. 10/29/13  Yes Mervin Kung, MD  lisinopril-hydrochlorothiazide (PRINZIDE,ZESTORETIC) 10-12.5 MG per tablet Take 1 tablet by mouth daily.   Yes Historical Provider, MD  ondansetron (ZOFRAN ODT) 4 MG disintegrating tablet Take 1 tablet (4 mg total) by mouth every 8 (eight) hours as needed. 10/29/13  Yes Mervin Kung, MD    Scheduled Meds: . cefTRIAXone (ROCEPHIN)  IV  2 g Intravenous Q12H  . potassium chloride  40 mEq Oral Daily  . sodium chloride  1,000 mL Intravenous Once  . sodium chloride  1,000 mL Intravenous Once   Continuous Infusions: . sodium chloride    . sodium chloride 100 mL/hr at 11/01/13 0600   PRN Meds:.   Blood pressure 99/54, pulse 114, temperature 100.2 F (37.9 C), temperature source Axillary, resp. rate 30, height '5\' 3"'  (1.6 m), weight 114.3 kg (251 lb 15.8 oz), last menstrual period 10/20/2013, SpO2 100.00%.   Results for orders placed during the hospital encounter of 10/31/13 (from the past 48 hour(s))  GLUCOSE, CAPILLARY     Status: Abnormal   Collection Time    10/31/13  3:55 PM      Result Value Range   Glucose-Capillary 116 (*) 70 - 99 mg/dL  CULTURE, BLOOD (ROUTINE X 2)     Status: None   Collection Time    10/31/13  4:08 PM      Result Value Range   Specimen Description BLOOD LEFT ARM     Special Requests AEB 6 CC     Culture PENDING     Report Status PENDING    LACTIC ACID, PLASMA     Status: None   Collection Time    10/31/13  4:08 PM      Result Value Range   Lactic Acid, Venous  1.5  0.5 - 2.2 mmol/L  CBC WITH DIFFERENTIAL     Status: Abnormal   Collection Time    10/31/13  4:09 PM      Result Value Range   WBC 11.5 (*) 4.0 - 10.5 K/uL   RBC 5.27 (*) 3.87 - 5.11 MIL/uL   Hemoglobin 14.4  12.0 - 15.0 g/dL   HCT 43.9  36.0 - 46.0 %   MCV 83.3  78.0 - 100.0 fL   MCH 27.3  26.0 - 34.0 pg   MCHC 32.8  30.0 - 36.0 g/dL   RDW 14.2  11.5 - 15.5 %   Platelets 310  150 - 400 K/uL   Neutrophils Relative % 49  43 - 77 %   Neutro Abs 5.7  1.7 - 7.7 K/uL   Lymphocytes Relative 40  12 - 46 %   Lymphs Abs 4.6 (*) 0.7 -  4.0 K/uL   Monocytes Relative 10  3 - 12 %   Monocytes Absolute 1.2 (*) 0.1 - 1.0 K/uL   Eosinophils Relative 1  0 - 5 %   Eosinophils Absolute 0.1  0.0 - 0.7 K/uL   Basophils Relative 0  0 - 1 %   Basophils Absolute 0.0  0.0 - 0.1 K/uL  BASIC METABOLIC PANEL     Status: Abnormal   Collection Time    10/31/13  4:09 PM      Result Value Range   Sodium 132 (*) 137 - 147 mEq/L   Potassium 3.3 (*) 3.7 - 5.3 mEq/L   Chloride 89 (*) 96 - 112 mEq/L   CO2 23  19 - 32 mEq/L   Glucose, Bld 116 (*) 70 - 99 mg/dL   BUN 27 (*) 6 - 23 mg/dL   Creatinine, Ser 3.19 (*) 0.50 - 1.10 mg/dL   Calcium 9.2  8.4 - 10.5 mg/dL   GFR calc non Af Amer 16 (*) >90 mL/min   GFR calc Af Amer 18 (*) >90 mL/min   Comment: (NOTE)     The eGFR has been calculated using the CKD EPI equation.     This calculation has not been validated in all clinical situations.     eGFR's persistently <90 mL/min signify possible Chronic Kidney     Disease.  HEPATIC FUNCTION PANEL     Status: Abnormal   Collection Time    10/31/13  4:09 PM      Result Value Range   Total Protein 8.0  6.0 - 8.3 g/dL   Albumin 3.6  3.5 - 5.2 g/dL   AST 22  0 - 37 U/L   ALT 17  0 - 35 U/L   Alkaline Phosphatase 104  39 - 117 U/L   Total Bilirubin 2.1 (*) 0.3 - 1.2 mg/dL   Bilirubin, Direct 0.5 (*) 0.0 - 0.3 mg/dL   Indirect Bilirubin 1.6 (*) 0.3 - 0.9 mg/dL  URINALYSIS, ROUTINE W REFLEX MICROSCOPIC      Status: Abnormal   Collection Time    10/31/13  4:45 PM      Result Value Range   Color, Urine YELLOW  YELLOW   APPearance CLOUDY (*) CLEAR   Specific Gravity, Urine 1.020  1.005 - 1.030   pH 6.0  5.0 - 8.0   Glucose, UA NEGATIVE  NEGATIVE mg/dL   Hgb urine dipstick NEGATIVE  NEGATIVE   Bilirubin Urine SMALL (*) NEGATIVE   Ketones, ur NEGATIVE  NEGATIVE mg/dL   Protein, ur NEGATIVE  NEGATIVE mg/dL   Urobilinogen, UA 2.0 (*) 0.0 - 1.0 mg/dL   Nitrite NEGATIVE  NEGATIVE   Leukocytes, UA NEGATIVE  NEGATIVE   Comment: MICROSCOPIC NOT DONE ON URINES WITH NEGATIVE PROTEIN, BLOOD, LEUKOCYTES, NITRITE, OR GLUCOSE <1000 mg/dL.  CG4 I-STAT (LACTIC ACID)     Status: None   Collection Time    10/31/13  4:59 PM      Result Value Range   Lactic Acid, Venous 1.74  0.5 - 2.2 mmol/L  URINE RAPID DRUG SCREEN (HOSP PERFORMED)     Status: Abnormal   Collection Time    10/31/13  8:23 PM      Result Value Range   Opiates POSITIVE (*) NONE DETECTED   Cocaine NONE DETECTED  NONE DETECTED   Benzodiazepines NONE DETECTED  NONE DETECTED   Amphetamines NONE DETECTED  NONE DETECTED   Tetrahydrocannabinol NONE DETECTED  NONE DETECTED   Barbiturates NONE  DETECTED  NONE DETECTED   Comment:            DRUG SCREEN FOR MEDICAL PURPOSES     ONLY.  IF CONFIRMATION IS NEEDED     FOR ANY PURPOSE, NOTIFY LAB     WITHIN 5 DAYS.                LOWEST DETECTABLE LIMITS     FOR URINE DRUG SCREEN     Drug Class       Cutoff (ng/mL)     Amphetamine      1000     Barbiturate      200     Benzodiazepine   063     Tricyclics       016     Opiates          300     Cocaine          300     THC              50  INFLUENZA PANEL BY PCR (TYPE A & B, H1N1)     Status: None   Collection Time    10/31/13 10:30 PM      Result Value Range   Influenza A By PCR NEGATIVE  NEGATIVE   Influenza B By PCR NEGATIVE  NEGATIVE   H1N1 flu by pcr NOT DETECTED  NOT DETECTED   Comment:            The Xpert Flu assay (FDA approved for      nasal aspirates or washes and     nasopharyngeal swab specimens), is     intended as an aid in the diagnosis of     influenza and should not be used as     a sole basis for treatment.  BASIC METABOLIC PANEL     Status: Abnormal   Collection Time    11/01/13  1:09 AM      Result Value Range   Sodium 138  137 - 147 mEq/L   Potassium 3.2 (*) 3.7 - 5.3 mEq/L   Chloride 99  96 - 112 mEq/L   Comment: DELTA CHECK NOTED   CO2 23  19 - 32 mEq/L   Glucose, Bld 101 (*) 70 - 99 mg/dL   BUN 21  6 - 23 mg/dL   Creatinine, Ser 1.69 (*) 0.50 - 1.10 mg/dL   Comment: DELTA CHECK NOTED   Calcium 8.5  8.4 - 10.5 mg/dL   GFR calc non Af Amer 34 (*) >90 mL/min   GFR calc Af Amer 39 (*) >90 mL/min   Comment: (NOTE)     The eGFR has been calculated using the CKD EPI equation.     This calculation has not been validated in all clinical situations.     eGFR's persistently <90 mL/min signify possible Chronic Kidney     Disease.  MRSA PCR SCREENING     Status: None   Collection Time    11/01/13  3:00 AM      Result Value Range   MRSA by PCR NEGATIVE  NEGATIVE   Comment:            The GeneXpert MRSA Assay (FDA     approved for NASAL specimens     only), is one component of a     comprehensive MRSA colonization     surveillance program. It is not     intended to diagnose MRSA  infection nor to guide or     monitor treatment for     MRSA infections.  BASIC METABOLIC PANEL     Status: Abnormal   Collection Time    11/01/13  5:12 AM      Result Value Range   Sodium 141  137 - 147 mEq/L   Potassium 3.0 (*) 3.7 - 5.3 mEq/L   Chloride 101  96 - 112 mEq/L   CO2 25  19 - 32 mEq/L   Glucose, Bld 91  70 - 99 mg/dL   BUN 18  6 - 23 mg/dL   Creatinine, Ser 1.50 (*) 0.50 - 1.10 mg/dL   Calcium 8.8  8.4 - 10.5 mg/dL   GFR calc non Af Amer 39 (*) >90 mL/min   GFR calc Af Amer 45 (*) >90 mL/min   Comment: (NOTE)     The eGFR has been calculated using the CKD EPI equation.     This calculation has  not been validated in all clinical situations.     eGFR's persistently <90 mL/min signify possible Chronic Kidney     Disease.  CBC     Status: None   Collection Time    11/01/13  5:12 AM      Result Value Range   WBC 8.5  4.0 - 10.5 K/uL   RBC 4.82  3.87 - 5.11 MIL/uL   Hemoglobin 12.8  12.0 - 15.0 g/dL   HCT 40.5  36.0 - 46.0 %   MCV 84.0  78.0 - 100.0 fL   MCH 26.6  26.0 - 34.0 pg   MCHC 31.6  30.0 - 36.0 g/dL   RDW 14.5  11.5 - 15.5 %   Platelets 287  150 - 400 K/uL  PROTIME-INR     Status: None   Collection Time    11/01/13  5:12 AM      Result Value Range   Prothrombin Time 14.3  11.6 - 15.2 seconds   INR 1.13  0.00 - 1.49    Ct Head Wo Contrast  10/31/2013   CLINICAL DATA:  Headaches since Monday.  History of hypertension.  EXAM: CT HEAD WITHOUT CONTRAST  TECHNIQUE: Contiguous axial images were obtained from the base of the skull through the vertex without intravenous contrast.  COMPARISON:  CT HEAD W/O CM dated 10/29/2013; CT MAXILLOFACIAL W/O CM SINUS dated 10/29/2013  FINDINGS: No mass lesion, mass effect, midline shift, hydrocephalus, hemorrhage. No territorial ischemia or acute infarction. Periventricular white matter disease is present, likely chronic ischemic although nonspecific. This appears unchanged compared to 10/29/2013. Unchanged mild paranasal sinus disease with mucosal thickening. Calvarium intact.  IMPRESSION: No acute intracranial abnormality. Unchanged periventricular white matter disease, nonspecific but likely chronic ischemic.   Electronically Signed   By: Dereck Ligas M.D.   On: 10/31/2013 22:51   Dg Chest Portable 1 View  10/31/2013   CLINICAL DATA:  Fever, hypotension.  EXAM: PORTABLE CHEST - 1 VIEW  COMPARISON:  09/18/2009  FINDINGS: Heart is borderline in size. Mild vascular congestion. No confluent opacities, effusions or edema. No acute bony abnormality.  IMPRESSION: Borderline heart size, mild vascular congestion   Electronically Signed   By: Rolm Baptise  M.D.   On: 10/31/2013 16:39        Darienne Belleau A. Merlene Laughter, M.D.  Diplomate, Tax adviser of Psychiatry and Neurology ( Neurology). 11/01/2013, 8:30 AM

## 2013-11-01 NOTE — Progress Notes (Signed)
ANTIBIOTIC CONSULT NOTE - INITIAL  Pharmacy Consult for vancomycin (with Rocephin) Indication: CNS infection - R/o meningitis  No Known Allergies  Patient Measurements: Height: 5\' 3"  (160 cm) Weight: 251 lb 15.8 oz (114.3 kg) IBW/kg (Calculated) : 52.4 Adjusted Body Weight: 73kg  Vital Signs: Temp: 99.9 F (37.7 C) (02/09 0000) Temp src: Oral (02/09 0000) BP: 154/86 mmHg (02/09 0000) Pulse Rate: 106 (02/08 2207) Intake/Output from previous day: 02/08 0701 - 02/09 0700 In: -  Out: 30 [Urine:30] Intake/Output from this shift:    Labs:  Recent Labs  10/31/13 1609  WBC 11.5*  HGB 14.4  PLT 310  CREATININE 3.19*   Estimated Creatinine Clearance: 25.1 ml/min (by C-G formula based on Cr of 3.19). No results found for this basename: VANCOTROUGH, Corlis Leak, VANCORANDOM, [place], GENTPEAK, GENTRANDOM, TOBRATROUGH, TOBRAPEAK, TOBRARND, AMIKACINPEAK, AMIKACINTROU, AMIKACIN,  in the last 72 hours   Microbiology: Recent Results (from the past 720 hour(s))  CULTURE, BLOOD (ROUTINE X 2)     Status: None   Collection Time    10/31/13  4:08 PM      Result Value Range Status   Specimen Description BLOOD LEFT ARM   Final   Special Requests AEB 6 CC   Final   Culture PENDING   Incomplete   Report Status PENDING   Incomplete    Medical History: Past Medical History  Diagnosis Date  . Hypertension     Medications:  Scheduled:  . cefTRIAXone (ROCEPHIN)  IV  2 g Intravenous Q12H  . sodium chloride  1,000 mL Intravenous Once  . sodium chloride  1,000 mL Intravenous Once  . vancomycin  1,000 mg Intravenous Once   Infusions:  . sodium chloride    . sodium chloride     PRN:   Assessment: 88yr obese female with HA/CNS sx's for 5 days complicated by acute renal failure.  Patient to be treated for possible bacterial meningitis until LP can be done, tx'd by high dose vancomycin and ceftriaxone.  Goal of Therapy:  1. Rocephin q12h for meningitis (no adjustment necessary for  poor renal function) 2.  Vancomycin desired troughs 17-63mcg/ml  Plan:  Because renal function will hopefully improve with hydration and holding of HTN meds, will give repeat dose of vancomycin empiracally in am.  Patient previously received 2gm Rocephin and 1000mg  Vancomycin last evening approx 6pm.  Penni Homans 11/01/2013,12:36 AM

## 2013-11-02 ENCOUNTER — Other Ambulatory Visit (HOSPITAL_COMMUNITY): Payer: BC Managed Care – PPO

## 2013-11-02 LAB — CRYPTOCOCCAL ANTIGEN, CSF: Crypto Ag: NEGATIVE

## 2013-11-02 LAB — HERPES SIMPLEX VIRUS(HSV) DNA BY PCR
HSV 1 DNA: NOT DETECTED
HSV 2 DNA: NOT DETECTED

## 2013-11-02 LAB — CBC
HCT: 39 % (ref 36.0–46.0)
HEMOGLOBIN: 12.6 g/dL (ref 12.0–15.0)
MCH: 27.4 pg (ref 26.0–34.0)
MCHC: 32.3 g/dL (ref 30.0–36.0)
MCV: 84.8 fL (ref 78.0–100.0)
Platelets: 273 10*3/uL (ref 150–400)
RBC: 4.6 MIL/uL (ref 3.87–5.11)
RDW: 14.5 % (ref 11.5–15.5)
WBC: 6.4 10*3/uL (ref 4.0–10.5)

## 2013-11-02 LAB — BASIC METABOLIC PANEL
BUN: 10 mg/dL (ref 6–23)
CHLORIDE: 102 meq/L (ref 96–112)
CO2: 26 meq/L (ref 19–32)
CREATININE: 0.94 mg/dL (ref 0.50–1.10)
Calcium: 9 mg/dL (ref 8.4–10.5)
GFR calc Af Amer: 79 mL/min — ABNORMAL LOW (ref >90)
GFR calc non Af Amer: 69 mL/min — ABNORMAL LOW (ref >90)
GLUCOSE: 100 mg/dL — AB (ref 70–99)
Potassium: 3.4 mEq/L — ABNORMAL LOW (ref 3.7–5.3)
Sodium: 141 mEq/L (ref 137–147)

## 2013-11-02 LAB — PATHOLOGIST SMEAR REVIEW

## 2013-11-02 MED ORDER — VANCOMYCIN HCL IN DEXTROSE 1-5 GM/200ML-% IV SOLN
1000.0000 mg | Freq: Two times a day (BID) | INTRAVENOUS | Status: DC
  Administered 2013-11-02 – 2013-11-03 (×3): 1000 mg via INTRAVENOUS
  Filled 2013-11-02 (×5): qty 200

## 2013-11-02 MED ORDER — DEXTROSE 5 % IV SOLN
1000.0000 mg | Freq: Three times a day (TID) | INTRAVENOUS | Status: DC
  Administered 2013-11-02 – 2013-11-03 (×3): 1000 mg via INTRAVENOUS
  Filled 2013-11-02 (×4): qty 20

## 2013-11-02 NOTE — Progress Notes (Signed)
Subjective: Patient is resting. She is complaining of intermittent episode of headache. No fever or chills. Talked to Dr.Campbell (ID specialist in [facility] and he reviewed her csf analysis result and advised to D/C acyclovir and continue Vancomycin and Rocephin as possible case of bacterial meningitis. Objective: Vital signs in last 24 hours: Temp:  [98.2 F (36.8 C)-100.3 F (37.9 C)] 98.9 F (37.2 C) (02/10 0800) Pulse Rate:  [95-110] 101 (02/09 2300) Resp:  [14-36] 21 (02/10 0800) BP: (103-135)/(56-80) 134/79 mmHg (02/10 0800) SpO2:  [94 %-99 %] 99 % (02/09 2300) Weight:  [114.3 kg (251 lb 15.8 oz)] 114.3 kg (251 lb 15.8 oz) (02/10 0500) Weight change: 1.808 kg (3 lb 15.8 oz) Last BM Date: 10/29/13  Intake/Output from previous day: 02/09 0701 - 02/10 0700 In: 3689.8 [I.V.:2868.8; IV Piggyback:821] Out: 700 [Urine:700]  PHYSICAL EXAM General appearance: alert and no distress Resp: clear to auscultation bilaterally Cardio: S1, S2 normal GI: soft, non-tender; bowel sounds normal; no masses,  no organomegaly Extremities: extremities normal, atraumatic, no cyanosis or edema  Lab Results:    @labtest @ [facility] No results found for this basename: PHART, PCO2, PO2ART, TCO2, HCO3,  in the last 72 hours CULTURES Recent Results (from the past 240 hour(s))  CULTURE, BLOOD (ROUTINE X 2)     Status: None   Collection Time    10/31/13  4:08 PM      Result Value Range Status   Specimen Description BLOOD LEFT ARM   Final   Special Requests AEB 6 CC   Final   Culture NO GROWTH 1 DAY   Final   Report Status PENDING   Incomplete  CULTURE, BLOOD (ROUTINE X 2)     Status: None   Collection Time    10/31/13  4:20 PM      Result Value Range Status   Specimen Description BLOOD RIGHT HAND   Final   Special Requests BOTTLES DRAWN AEROBIC AND ANAEROBIC 8CC   Final   Culture NO GROWTH 1 DAY   Final   Report Status PENDING   Incomplete  MRSA PCR SCREENING     Status: None   Collection Time   11/01/13  3:00 AM      Result Value Range Status   MRSA by PCR NEGATIVE  NEGATIVE Final   Comment:            The GeneXpert MRSA Assay (FDA     approved for NASAL specimens     only), is one component of a     comprehensive MRSA colonization     surveillance program. It is not     intended to diagnose MRSA     infection nor to guide or     monitor treatment for     MRSA infections.  CSF CULTURE     Status: None   Collection Time    11/01/13  2:25 PM      Result Value Range Status   Specimen Description CSF   Final   Special Requests NONE   Final   Gram Stain     Final   Value: CYTOSPIN WBC PRESENT,BOTH PMN AND MONONUCLEAR     NO ORGANISMS SEEN     Performed at [facility]   Culture     Final   Value: NO GROWTH     Performed at [facility]   Report Status PENDING   Incomplete   Studies/Results: Ct Head Wo Contrast  10/31/2013   CLINICAL DATA:  Headaches  since Monday.  History of hypertension.  EXAM: CT HEAD WITHOUT CONTRAST  TECHNIQUE: Contiguous axial images were obtained from the base of the skull through the vertex without intravenous contrast.  COMPARISON:  CT HEAD W/O CM dated 10/29/2013; CT MAXILLOFACIAL W/O CM SINUS dated 10/29/2013  FINDINGS: No mass lesion, mass effect, midline shift, hydrocephalus, hemorrhage. No territorial ischemia or acute infarction. Periventricular white matter disease is present, likely chronic ischemic although nonspecific. This appears unchanged compared to 10/29/2013. Unchanged mild paranasal sinus disease with mucosal thickening. Calvarium intact.  IMPRESSION: No acute intracranial abnormality. Unchanged periventricular white matter disease, nonspecific but likely chronic ischemic.   Electronically Signed   By: Dereck Ligas M.D.   On: 10/31/2013 22:51   Mr Brain Wo Contrast  11/01/2013   CLINICAL DATA:  Altered mental status, headache, weakness  EXAM: MRI HEAD WITHOUT CONTRAST  TECHNIQUE: Multiplanar, multiecho pulse sequences of the  brain and surrounding structures were obtained without intravenous contrast.  COMPARISON:  Prior CT from 10/31/2013  FINDINGS: Study is degraded by motion artifact.  Multiple foci of abnormal restricted diffusion are seen involving the periventricular and deep white matter of the centrum semi ovales bilaterally. Corresponding signal drop out seen on the [facility] map. For reference purposes, the largest of these on the left measures approximately 9 mm and is located in the left frontal lobe (series 10, image 38). The largest of these lesions on the right measures 6 mm and is located in the right frontal lobe (series 10, image 37). These are localized predominantly within the frontal lobes along the junction of the superficial and deep perforator vessels. There is question of a small focus of restricted diffusion within the left temporal lobe (series 10, image 33). No infratentorial infarcts identified. No intracranial hemorrhage is seen.  Scattered and confluent T2/FLAIR hyperintensity within the periventricular white matter most compatible with mild chronic microvascular ischemic disease.  No mass lesion, mass effect, or midline shift. Ventricles are normal in size without evidence of hydrocephalus. No extra-axial fluid collection.  Calvarium is intact. Craniocervical junction is not well evaluated. Pituitary gland and orbits are not well evaluated as well.  IMPRESSION: Multi focal subcentimeter ischemic infarcts involving the periventricular and deep white matter of both cerebral hemispheres as above. These foci are largely in a watershed distribution, and may be related to underlying hypotension or prior hypotensive episode. No definite signal changes involving the temporal lobes bilaterally to suggest herpes encephalitis, although evaluation is limited due to extensive motion artifact.   Electronically Signed   By: Jeannine Boga M.D.   On: 11/01/2013 21:32   Dg Chest Portable 1 View  10/31/2013   CLINICAL  DATA:  Fever, hypotension.  EXAM: PORTABLE CHEST - 1 VIEW  COMPARISON:  09/18/2009  FINDINGS: Heart is borderline in size. Mild vascular congestion. No confluent opacities, effusions or edema. No acute bony abnormality.  IMPRESSION: Borderline heart size, mild vascular congestion   Electronically Signed   By: Rolm Baptise M.D.   On: 10/31/2013 16:39   Dg Fluoro Guide Lumbar Puncture  11/01/2013   CLINICAL DATA:  Headache, fever  EXAM: DIAGNOSTIC LUMBAR PUNCTURE UNDER FLUOROSCOPIC GUIDANCE  FLUOROSCOPY TIME:  0 min 36 seconds  PROCEDURE: Procedure, benefits, and risks were discussed with the patient, including alternatives.  Patient's questions were answered.  Written informed consent was obtained.  Timeout protocol followed.  Patient placed prone.  L4-L5 disc space was localized under fluoroscopy.  Skin prepped and draped in usual sterile fashion.  Skin and  soft tissues anesthetized with 1.5 mL of 1% lidocaine.  22 gauge needle was advanced into the spinal canal where clear colorless CSF was encountered with an opening pressure of 3 cm H2O (prone).  9 mL of CSF was obtained in 4 tubes for requested analysis.  Procedure tolerated very well by patient without immediate complication.  IMPRESSION: Fluoroscopic guided lumbar puncture as above.   Electronically Signed   By: Lavonia Dana M.D.   On: 11/01/2013 15:13    Medications: I have reviewed the patient's current medications.  Assesment: Principal Problem:   Fever Active Problems:   Acute renal failure   Headache   Hypotension   Altered mental state    Plan: Will continue Iv antibiotics Neurology consult appreciated will D/C acyclovir as recommended by ID      LOS: 2 days   Maryclaire Stoecker 11/02/2013, 8:12 AM

## 2013-11-02 NOTE — Progress Notes (Signed)
ANTIBIOTIC CONSULT NOTE  Pharmacy Consult for vancomycin (with Rocephin) Indication: CNS infection - R/o meningitis  No Known Allergies  Patient Measurements: Height: 5\' 3"  (160 cm) Weight: 251 lb 15.8 oz (114.3 kg) IBW/kg (Calculated) : 52.4  Vital Signs: Temp: 98.7 F (37.1 C) (02/10 0500) Temp src: Axillary (02/10 0000) BP: 133/64 mmHg (02/10 0600) Pulse Rate: 101 (02/09 2300) Intake/Output from previous day: 02/09 0701 - 02/10 0700 In: 3689.8 [I.V.:2868.8; IV Piggyback:821] Out: 700 [Urine:700] Intake/Output from this shift:    Labs:  Recent Labs  10/31/13 1609 11/01/13 0109 11/01/13 0512 11/02/13 0504  WBC 11.5*  --  8.5 6.4  HGB 14.4  --  12.8 12.6  PLT 310  --  287 273  CREATININE 3.19* 1.69* 1.50* 0.94   Estimated Creatinine Clearance: 85.3 ml/min (by C-G formula based on Cr of 0.94). No results found for this basename: VANCOTROUGH, Corlis Leak, VANCORANDOM, GENTTROUGH, GENTPEAK, GENTRANDOM, TOBRATROUGH, TOBRAPEAK, TOBRARND, AMIKACINPEAK, AMIKACINTROU, AMIKACIN,  in the last 72 hours   Microbiology: Recent Results (from the past 720 hour(s))  CULTURE, BLOOD (ROUTINE X 2)     Status: None   Collection Time    10/31/13  4:08 PM      Result Value Range Status   Specimen Description BLOOD LEFT ARM   Final   Special Requests AEB 6 CC   Final   Culture NO GROWTH 1 DAY   Final   Report Status PENDING   Incomplete  CULTURE, BLOOD (ROUTINE X 2)     Status: None   Collection Time    10/31/13  4:20 PM      Result Value Range Status   Specimen Description BLOOD RIGHT HAND   Final   Special Requests BOTTLES DRAWN AEROBIC AND ANAEROBIC 8CC   Final   Culture NO GROWTH 1 DAY   Final   Report Status PENDING   Incomplete  MRSA PCR SCREENING     Status: None   Collection Time    11/01/13  3:00 AM      Result Value Range Status   MRSA by PCR NEGATIVE  NEGATIVE Final   Comment:            The GeneXpert MRSA Assay (FDA     approved for NASAL specimens     only), is  one component of a     comprehensive MRSA colonization     surveillance program. It is not     intended to diagnose MRSA     infection nor to guide or     monitor treatment for     MRSA infections.  CSF CULTURE     Status: None   Collection Time    11/01/13  2:25 PM      Result Value Range Status   Specimen Description CSF   Final   Special Requests NONE   Final   Gram Stain     Final   Value: CYTOSPIN WBC PRESENT,BOTH PMN AND MONONUCLEAR     NO ORGANISMS SEEN     Performed at [facility]   Culture     Final   Value: NO GROWTH     Performed at [facility]   Report Status PENDING   Incomplete    Medical History: Past Medical History  Diagnosis Date  . Hypertension     Medications:  Scheduled:  . acyclovir  10 mg/kg (Ideal) Intravenous Q8H  . cefTRIAXone (ROCEPHIN)  IV  2 g Intravenous Q12H  . sodium chloride  1,000 mL Intravenous Once  . sodium chloride  1,000 mL Intravenous Once  . vancomycin  1,000 mg Intravenous Q12H   Infusions:  . sodium chloride 125 mL/hr at 11/02/13 0600   Assessment: 53yo obese female with HA/CNS sx's for 5 days complicated by acute renal failure.  She has been started on empiric treatment for meningitis.  LP pending.  SCr has improved since admission.  Estimated Creatinine Clearance: 85.3 ml/min (by C-G formula based on Cr of 0.94). Pt is currently afebrile. WBC is trending down.    Vancomycin 2/8>> Rocephin 2/8>>  Goal of Therapy:  Vancomycin trough 15-20 mcg/ml  Plan:  1.  Change Vancomycin to 1000mg  IV q12h (SCR improved) 2.  Check Vancomycin trough at steady state 3.  Continue Rocephin 2gm IV q12h 4.  Monitor renal function, cx data, & LP results  [place] Crane, Kayden Amend A 11/02/2013,7:56 AM

## 2013-11-02 NOTE — Progress Notes (Signed)
Patient ID: CLARITY CISZEK, female   DOB: [birth year], 53 y.o.   MRN: [id number]   [place] A. Merlene Laughter, MD     [email or web address]          Alison Elliott is an 53 y.o. female.   Assessment/Plan: The presentation of the patient's symptoms ( AMS, slow speech fever and headaches )are worrisome for CNS infection. The patient's CSF analysis reviewed along with the MRI findings and the concern that she could have a partially treated bacterial meningitis or even still a viral encephalitis. The increased RBC along with the patient's stroke is worrisome for herpes encephalitis which can present with Hemorrhagic changes causing infarcts. Although the acyclovir has been discontinued after discussion with infectious disease, think we should restart his until the PCR titers have returned. We'll continue to follow the rest of the workup. The stroke seen on MRI could be due to hypovolemia while the patient was septic or again hemorrhagic changes from encephalitis. We'll follow the EEG findings. She will continue on the current antibiotics for bacterial meningitis. Rickettsial titers will also be obtained.  Watershed infarct. Likely due to hypovolemia or changes from encephalitis. Obviously not candidate for TPA due to because of the therapy when there are and the overall medical conditions. May consider aspirin therapy once we have a clearer picture of the patient's clinical situation.  Patient seems improved in terms of cognition. The family thinks that the patient has also Improved. There may be Concerned that some of the patient's encephalopathy could be due to medication effect from her pain medications.       GENERAL: This is an obese lady in no acute distress. She is drowsy and sleepy throughout the evaluation.  HEENT: Neck is supple. She does have some dizziness on range of motion but again, the neck seems supple. ABDOMEN: soft  EXTREMITIES: No edema  BACK: Unremarkable.  SKIN:  Normal by inspection.  MENTAL STATUS: Today she is awake and alert. She is oriented to year month and date. She follows commands well and speech is normal. CRANIAL NERVES: Pupils are equal, round and reactive to light and accommodation; extra ocular movements are full, there is no significant nystagmus; visual fields are full; upper and lower facial muscles are normal in strength and symmetric, there is no flattening of the nasolabial folds; tongue is midline; uvula is midline; shoulder elevation is normal. Attempted funduscopic examination shows that this appears to be flat but very limited due to lack of cooperation.  MOTOR: Normal tone, bulk and strength; no pronator drift.  COORDINATION: Left finger to nose is normal, right finger to nose is normal, No rest tremor; no intention tremor; no postural tremor; no bradykinesia.  REFLEXES: Deep tendon reflexes are symmetrical and normal except at the knees where they are quite diminished but normal at the ankles. Plantar responses are flexor bilaterally.  SENSATION: Normal to light touch.      Objective: Vital signs in last 24 hours: Temp:  [98.2 F (36.8 C)-100.3 F (37.9 C)] 98.9 F (37.2 C) (02/10 0800) Pulse Rate:  [95-110] 101 (02/09 2300) Resp:  [14-36] 21 (02/10 0800) BP: (103-135)/(56-80) 134/79 mmHg (02/10 0800) SpO2:  [94 %-99 %] 99 % (02/09 2300) Weight:  [114.3 kg (251 lb 15.8 oz)] 114.3 kg (251 lb 15.8 oz) (02/10 0500)  Intake/Output from previous day: 02/09 0701 - 02/10 0700 In: 3689.8 [I.V.:2868.8; IV Piggyback:821] Out: 700 [Urine:700] Intake/Output this shift:   Nutritional status: Sodium Restricted   Lab Results: Results  for orders placed during the hospital encounter of 10/31/13 (from the past 48 hour(s))  GLUCOSE, CAPILLARY     Status: Abnormal   Collection Time    10/31/13  3:55 PM      Result Value Range   Glucose-Capillary 116 (*) 70 - 99 mg/dL  CULTURE, BLOOD (ROUTINE X 2)     Status: None   Collection  Time    10/31/13  4:08 PM      Result Value Range   Specimen Description BLOOD LEFT ARM     Special Requests AEB 6 CC     Culture NO GROWTH 1 DAY     Report Status PENDING    LACTIC ACID, PLASMA     Status: None   Collection Time    10/31/13  4:08 PM      Result Value Range   Lactic Acid, Venous 1.5  0.5 - 2.2 mmol/L  CBC WITH DIFFERENTIAL     Status: Abnormal   Collection Time    10/31/13  4:09 PM      Result Value Range   WBC 11.5 (*) 4.0 - 10.5 K/uL   RBC 5.27 (*) 3.87 - 5.11 MIL/uL   Hemoglobin 14.4  12.0 - 15.0 g/dL   HCT 43.9  36.0 - 46.0 %   MCV 83.3  78.0 - 100.0 fL   MCH 27.3  26.0 - 34.0 pg   MCHC 32.8  30.0 - 36.0 g/dL   RDW 14.2  11.5 - 15.5 %   Platelets 310  150 - 400 K/uL   Neutrophils Relative % 49  43 - 77 %   Neutro Abs 5.7  1.7 - 7.7 K/uL   Lymphocytes Relative 40  12 - 46 %   Lymphs Abs 4.6 (*) 0.7 - 4.0 K/uL   Monocytes Relative 10  3 - 12 %   Monocytes Absolute 1.2 (*) 0.1 - 1.0 K/uL   Eosinophils Relative 1  0 - 5 %   Eosinophils Absolute 0.1  0.0 - 0.7 K/uL   Basophils Relative 0  0 - 1 %   Basophils Absolute 0.0  0.0 - 0.1 K/uL  BASIC METABOLIC PANEL     Status: Abnormal   Collection Time    10/31/13  4:09 PM      Result Value Range   Sodium 132 (*) 137 - 147 mEq/L   Potassium 3.3 (*) 3.7 - 5.3 mEq/L   Chloride 89 (*) 96 - 112 mEq/L   CO2 23  19 - 32 mEq/L   Glucose, Bld 116 (*) 70 - 99 mg/dL   BUN 27 (*) 6 - 23 mg/dL   Creatinine, Ser 3.19 (*) 0.50 - 1.10 mg/dL   Calcium 9.2  8.4 - 10.5 mg/dL   GFR calc non Af Amer 16 (*) >90 mL/min   GFR calc Af Amer 18 (*) >90 mL/min   Comment: (NOTE)     The eGFR has been calculated using the CKD EPI equation.     This calculation has not been validated in all clinical situations.     eGFR's persistently <90 mL/min signify possible Chronic Kidney     Disease.  HEPATIC FUNCTION PANEL     Status: Abnormal   Collection Time    10/31/13  4:09 PM      Result Value Range   Total Protein 8.0  6.0 - 8.3  g/dL   Albumin 3.6  3.5 - 5.2 g/dL   AST 22  0 - 37 U/L  ALT 17  0 - 35 U/L   Alkaline Phosphatase 104  39 - 117 U/L   Total Bilirubin 2.1 (*) 0.3 - 1.2 mg/dL   Bilirubin, Direct 0.5 (*) 0.0 - 0.3 mg/dL   Indirect Bilirubin 1.6 (*) 0.3 - 0.9 mg/dL  CULTURE, BLOOD (ROUTINE X 2)     Status: None   Collection Time    10/31/13  4:20 PM      Result Value Range   Specimen Description BLOOD RIGHT HAND     Special Requests BOTTLES DRAWN AEROBIC AND ANAEROBIC 8CC     Culture NO GROWTH 1 DAY     Report Status PENDING    URINALYSIS, ROUTINE W REFLEX MICROSCOPIC     Status: Abnormal   Collection Time    10/31/13  4:45 PM      Result Value Range   Color, Urine YELLOW  YELLOW   APPearance CLOUDY (*) CLEAR   Specific Gravity, Urine 1.020  1.005 - 1.030   pH 6.0  5.0 - 8.0   Glucose, UA NEGATIVE  NEGATIVE mg/dL   Hgb urine dipstick NEGATIVE  NEGATIVE   Bilirubin Urine SMALL (*) NEGATIVE   Ketones, ur NEGATIVE  NEGATIVE mg/dL   Protein, ur NEGATIVE  NEGATIVE mg/dL   Urobilinogen, UA 2.0 (*) 0.0 - 1.0 mg/dL   Nitrite NEGATIVE  NEGATIVE   Leukocytes, UA NEGATIVE  NEGATIVE   Comment: MICROSCOPIC NOT DONE ON URINES WITH NEGATIVE PROTEIN, BLOOD, LEUKOCYTES, NITRITE, OR GLUCOSE <1000 mg/dL.  CG4 I-STAT (LACTIC ACID)     Status: None   Collection Time    10/31/13  4:59 PM      Result Value Range   Lactic Acid, Venous 1.74  0.5 - 2.2 mmol/L  URINE RAPID DRUG SCREEN (HOSP PERFORMED)     Status: Abnormal   Collection Time    10/31/13  8:23 PM      Result Value Range   Opiates POSITIVE (*) NONE DETECTED   Cocaine NONE DETECTED  NONE DETECTED   Benzodiazepines NONE DETECTED  NONE DETECTED   Amphetamines NONE DETECTED  NONE DETECTED   Tetrahydrocannabinol NONE DETECTED  NONE DETECTED   Barbiturates NONE DETECTED  NONE DETECTED   Comment:            DRUG SCREEN FOR MEDICAL PURPOSES     ONLY.  IF CONFIRMATION IS NEEDED     FOR ANY PURPOSE, NOTIFY LAB     WITHIN 5 DAYS.                LOWEST  DETECTABLE LIMITS     FOR URINE DRUG SCREEN     Drug Class       Cutoff (ng/mL)     Amphetamine      1000     Barbiturate      200     Benzodiazepine   962     Tricyclics       836     Opiates          300     Cocaine          300     THC              50  INFLUENZA PANEL BY PCR (TYPE A & B, H1N1)     Status: None   Collection Time    10/31/13 10:30 PM      Result Value Range   Influenza A By PCR NEGATIVE  NEGATIVE   Influenza  B By PCR NEGATIVE  NEGATIVE   H1N1 flu by pcr NOT DETECTED  NOT DETECTED   Comment:            The Xpert Flu assay (FDA approved for     nasal aspirates or washes and     nasopharyngeal swab specimens), is     intended as an aid in the diagnosis of     influenza and should not be used as     a sole basis for treatment.  BASIC METABOLIC PANEL     Status: Abnormal   Collection Time    11/01/13  1:09 AM      Result Value Range   Sodium 138  137 - 147 mEq/L   Potassium 3.2 (*) 3.7 - 5.3 mEq/L   Chloride 99  96 - 112 mEq/L   Comment: DELTA CHECK NOTED   CO2 23  19 - 32 mEq/L   Glucose, Bld 101 (*) 70 - 99 mg/dL   BUN 21  6 - 23 mg/dL   Creatinine, Ser 1.69 (*) 0.50 - 1.10 mg/dL   Comment: DELTA CHECK NOTED   Calcium 8.5  8.4 - 10.5 mg/dL   GFR calc non Af Amer 34 (*) >90 mL/min   GFR calc Af Amer 39 (*) >90 mL/min   Comment: (NOTE)     The eGFR has been calculated using the CKD EPI equation.     This calculation has not been validated in all clinical situations.     eGFR's persistently <90 mL/min signify possible Chronic Kidney     Disease.  MRSA PCR SCREENING     Status: None   Collection Time    11/01/13  3:00 AM      Result Value Range   MRSA by PCR NEGATIVE  NEGATIVE   Comment:            The GeneXpert MRSA Assay (FDA     approved for NASAL specimens     only), is one component of a     comprehensive MRSA colonization     surveillance program. It is not     intended to diagnose MRSA     infection nor to guide or     monitor treatment for       MRSA infections.  BASIC METABOLIC PANEL     Status: Abnormal   Collection Time    11/01/13  5:12 AM      Result Value Range   Sodium 141  137 - 147 mEq/L   Potassium 3.0 (*) 3.7 - 5.3 mEq/L   Chloride 101  96 - 112 mEq/L   CO2 25  19 - 32 mEq/L   Glucose, Bld 91  70 - 99 mg/dL   BUN 18  6 - 23 mg/dL   Creatinine, Ser 1.50 (*) 0.50 - 1.10 mg/dL   Calcium 8.8  8.4 - 10.5 mg/dL   GFR calc non Af Amer 39 (*) >90 mL/min   GFR calc Af Amer 45 (*) >90 mL/min   Comment: (NOTE)     The eGFR has been calculated using the CKD EPI equation.     This calculation has not been validated in all clinical situations.     eGFR's persistently <90 mL/min signify possible Chronic Kidney     Disease.  CBC     Status: None   Collection Time    11/01/13  5:12 AM      Result Value Range   WBC 8.5  4.0 -  10.5 K/uL   RBC 4.82  3.87 - 5.11 MIL/uL   Hemoglobin 12.8  12.0 - 15.0 g/dL   HCT 40.5  36.0 - 46.0 %   MCV 84.0  78.0 - 100.0 fL   MCH 26.6  26.0 - 34.0 pg   MCHC 31.6  30.0 - 36.0 g/dL   RDW 14.5  11.5 - 15.5 %   Platelets 287  150 - 400 K/uL  PROTIME-INR     Status: None   Collection Time    11/01/13  5:12 AM      Result Value Range   Prothrombin Time 14.3  11.6 - 15.2 seconds   INR 1.13  0.00 - 1.49  PROTEIN AND GLUCOSE, CSF     Status: Abnormal   Collection Time    11/01/13  2:25 PM      Result Value Range   Glucose, CSF 56  43 - 76 mg/dL   Total  Protein, CSF 66 (*) 15 - 45 mg/dL  CSF CULTURE     Status: None   Collection Time    11/01/13  2:25 PM      Result Value Range   Specimen Description CSF     Special Requests NONE     Gram Stain       Value: CYTOSPIN WBC PRESENT,BOTH PMN AND MONONUCLEAR     NO ORGANISMS SEEN     Performed at [facility]   Culture       Value: NO GROWTH     Performed at [facility]   Report Status PENDING    CSF CELL COUNT WITH DIFFERENTIAL     Status: Abnormal   Collection Time    11/01/13  2:25 PM      Result Value Range    Tube # 3     Color, CSF COLORLESS  COLORLESS   Appearance, CSF CLEAR  CLEAR   Supernatant NOT INDICATED     RBC Count, CSF 23 (*) 0 /cu mm   WBC, CSF 58 (*) 0 - 5 /cu mm   Comment: CRITICAL RESULT CALLED TO, READ BACK BY AND VERIFIED WITH:     CHILDRESS,J. AT 1640 ON 11/01/2013 BY BAUGHAM,M.   Segmented Neutrophils-CSF 24 (*) 0 - 6 %   Lymphs, CSF 65  40 - 80 %   Monocyte-Macrophage-Spinal Fluid 11 (*) 15 - 45 %   Eosinophils, CSF 0  0 - 1 %   Other Cells, CSF PENDING PATHOLOGIST REVIEW    CRYPTOCOCCAL ANTIGEN, CSF     Status: None   Collection Time    11/01/13  2:25 PM      Result Value Range   Crypto Ag NEGATIVE  NEGATIVE   Cryptococcal Ag Titer NOT INDICATED  NOT INDICATED   Comment: Performed at [facility]  CBC     Status: None   Collection Time    11/02/13  5:04 AM      Result Value Range   WBC 6.4  4.0 - 10.5 K/uL   RBC 4.60  3.87 - 5.11 MIL/uL   Hemoglobin 12.6  12.0 - 15.0 g/dL   HCT 39.0  36.0 - 46.0 %   MCV 84.8  78.0 - 100.0 fL   MCH 27.4  26.0 - 34.0 pg   MCHC 32.3  30.0 - 36.0 g/dL   RDW 14.5  11.5 - 15.5 %   Platelets 273  150 - 400 K/uL  BASIC METABOLIC PANEL     Status:  Abnormal   Collection Time    11/02/13  5:04 AM      Result Value Range   Sodium 141  137 - 147 mEq/L   Potassium 3.4 (*) 3.7 - 5.3 mEq/L   Chloride 102  96 - 112 mEq/L   CO2 26  19 - 32 mEq/L   Glucose, Bld 100 (*) 70 - 99 mg/dL   BUN 10  6 - 23 mg/dL   Creatinine, Ser 0.94  0.50 - 1.10 mg/dL   Comment: DELTA CHECK NOTED   Calcium 9.0  8.4 - 10.5 mg/dL   GFR calc non Af Amer 69 (*) >90 mL/min   GFR calc Af Amer 79 (*) >90 mL/min   Comment: (NOTE)     The eGFR has been calculated using the CKD EPI equation.     This calculation has not been validated in all clinical situations.     eGFR's persistently <90 mL/min signify possible Chronic Kidney     Disease.    Lipid Panel No results found for this basename: CHOL, TRIG, HDL, CHOLHDL, VLDL, LDLCALC,  in the last 72  hours  Studies/Results: Ct Head Wo Contrast  10/31/2013   CLINICAL DATA:  Headaches since Monday.  History of hypertension.  EXAM: CT HEAD WITHOUT CONTRAST  TECHNIQUE: Contiguous axial images were obtained from the base of the skull through the vertex without intravenous contrast.  COMPARISON:  CT HEAD W/O CM dated 10/29/2013; CT MAXILLOFACIAL W/O CM SINUS dated 10/29/2013  FINDINGS: No mass lesion, mass effect, midline shift, hydrocephalus, hemorrhage. No territorial ischemia or acute infarction. Periventricular white matter disease is present, likely chronic ischemic although nonspecific. This appears unchanged compared to 10/29/2013. Unchanged mild paranasal sinus disease with mucosal thickening. Calvarium intact.  IMPRESSION: No acute intracranial abnormality. Unchanged periventricular white matter disease, nonspecific but likely chronic ischemic.   Electronically Signed   By: Dereck Ligas M.D.   On: 10/31/2013 22:51   Mr Brain Wo Contrast  11/01/2013   CLINICAL DATA:  Altered mental status, headache, weakness  EXAM: MRI HEAD WITHOUT CONTRAST  TECHNIQUE: Multiplanar, multiecho pulse sequences of the brain and surrounding structures were obtained without intravenous contrast.  COMPARISON:  Prior CT from 10/31/2013  FINDINGS: Study is degraded by motion artifact.  Multiple foci of abnormal restricted diffusion are seen involving the periventricular and deep white matter of the centrum semi ovales bilaterally. Corresponding signal drop out seen on the [facility] map. For reference purposes, the largest of these on the left measures approximately 9 mm and is located in the left frontal lobe (series 10, image 38). The largest of these lesions on the right measures 6 mm and is located in the right frontal lobe (series 10, image 37). These are localized predominantly within the frontal lobes along the junction of the superficial and deep perforator vessels. There is question of a small focus of restricted diffusion within  the left temporal lobe (series 10, image 33). No infratentorial infarcts identified. No intracranial hemorrhage is seen.  Scattered and confluent T2/FLAIR hyperintensity within the periventricular white matter most compatible with mild chronic microvascular ischemic disease.  No mass lesion, mass effect, or midline shift. Ventricles are normal in size without evidence of hydrocephalus. No extra-axial fluid collection.  Calvarium is intact. Craniocervical junction is not well evaluated. Pituitary gland and orbits are not well evaluated as well.  IMPRESSION: Multi focal subcentimeter ischemic infarcts involving the periventricular and deep white matter of both cerebral hemispheres as above. These foci are largely in  a watershed distribution, and may be related to underlying hypotension or prior hypotensive episode. No definite signal changes involving the temporal lobes bilaterally to suggest herpes encephalitis, although evaluation is limited due to extensive motion artifact.   Electronically Signed   By: Jeannine Boga M.D.   On: 11/01/2013 21:32   Dg Chest Portable 1 View  10/31/2013   CLINICAL DATA:  Fever, hypotension.  EXAM: PORTABLE CHEST - 1 VIEW  COMPARISON:  09/18/2009  FINDINGS: Heart is borderline in size. Mild vascular congestion. No confluent opacities, effusions or edema. No acute bony abnormality.  IMPRESSION: Borderline heart size, mild vascular congestion   Electronically Signed   By: Rolm Baptise M.D.   On: 10/31/2013 16:39   Dg Fluoro Guide Lumbar Puncture  11/01/2013   CLINICAL DATA:  Headache, fever  EXAM: DIAGNOSTIC LUMBAR PUNCTURE UNDER FLUOROSCOPIC GUIDANCE  FLUOROSCOPY TIME:  0 min 36 seconds  PROCEDURE: Procedure, benefits, and risks were discussed with the patient, including alternatives.  Patient's questions were answered.  Written informed consent was obtained.  Timeout protocol followed.  Patient placed prone.  L4-L5 disc space was localized under fluoroscopy.  Skin prepped and  draped in usual sterile fashion.  Skin and soft tissues anesthetized with 1.5 mL of 1% lidocaine.  22 gauge needle was advanced into the spinal canal where clear colorless CSF was encountered with an opening pressure of 3 cm H2O (prone).  9 mL of CSF was obtained in 4 tubes for requested analysis.  Procedure tolerated very well by patient without immediate complication.  IMPRESSION: Fluoroscopic guided lumbar puncture as above.   Electronically Signed   By: Lavonia Dana M.D.   On: 11/01/2013 15:13    Medications:  Scheduled Meds: . cefTRIAXone (ROCEPHIN)  IV  2 g Intravenous Q12H  . sodium chloride  1,000 mL Intravenous Once  . sodium chloride  1,000 mL Intravenous Once  . vancomycin  1,000 mg Intravenous Q12H   Continuous Infusions: . sodium chloride 125 mL/hr at 11/02/13 0600   PRN Meds:.acetaminophen     LOS: 2 days   Ronald Vinsant A. Merlene Laughter, M.D.  Diplomate, Tax adviser of Psychiatry and Neurology ( Neurology).

## 2013-11-02 NOTE — Clinical Documentation Improvement (Signed)
THIS DOCUMENT IS NOT A PERMANENT PART OF THE MEDICAL RECORD  Please update your documentation with the medical record to reflect your response to this query. If you need help knowing how to do this please call [phone].  11/02/13  Dear Dr. Ellwood Sayers  A review of the patient medical record has revealed the following indicators.  Based on your clinical judgment, please clarify and document in a progress note and/or discharge summary the clinical condition associated with the following supporting information:  Possible Clinical conditions  Morbid Obesity W/ BMI= 44.65 Other condition___________________ Cannot Clinically determine _____________  Risk Factors: Sign & Symptoms: Weight: 251 lbs Height  5\' 3"  BMI= 44.65  Treatment Nutrition Note: Body mass index is 44.65 kg/(m^2). Patient meets criteria for extreme obesity, class III based on current BMI.  Currently NPO  Reviewed: no response Thank You,  [place] Clinical Documentation Specialist: [place]

## 2013-11-03 ENCOUNTER — Inpatient Hospital Stay (HOSPITAL_COMMUNITY)
Admit: 2013-11-03 | Discharge: 2013-11-03 | Disposition: A | Discharge status: Home / Self Care | Payer: BC Managed Care – PPO | Attending: Neurology | Admitting: Neurology

## 2013-11-03 LAB — CBC
HCT: 39.4 % (ref 36.0–46.0)
Hemoglobin: 12.7 g/dL (ref 12.0–15.0)
MCH: 27.1 pg (ref 26.0–34.0)
MCHC: 32.2 g/dL (ref 30.0–36.0)
MCV: 84 fL (ref 78.0–100.0)
PLATELETS: 271 10*3/uL (ref 150–400)
RBC: 4.69 MIL/uL (ref 3.87–5.11)
RDW: 14.5 % (ref 11.5–15.5)
WBC: 7.1 10*3/uL (ref 4.0–10.5)

## 2013-11-03 LAB — BASIC METABOLIC PANEL
BUN: 6 mg/dL (ref 6–23)
CALCIUM: 8.8 mg/dL (ref 8.4–10.5)
CO2: 25 mEq/L (ref 19–32)
CREATININE: 0.94 mg/dL (ref 0.50–1.10)
Chloride: 104 mEq/L (ref 96–112)
GFR calc Af Amer: 79 mL/min — ABNORMAL LOW (ref >90)
GFR calc non Af Amer: 69 mL/min — ABNORMAL LOW (ref >90)
GLUCOSE: 97 mg/dL (ref 70–99)
Potassium: 3.1 mEq/L — ABNORMAL LOW (ref 3.7–5.3)
Sodium: 142 mEq/L (ref 137–147)

## 2013-11-03 LAB — VDRL, CSF: VDRL Quant, CSF: NONREACTIVE

## 2013-11-03 MED ORDER — POTASSIUM CHLORIDE CRYS ER 20 MEQ PO TBCR
EXTENDED_RELEASE_TABLET | ORAL | Status: AC
  Filled 2013-11-03: qty 2

## 2013-11-03 MED ORDER — POTASSIUM CHLORIDE CRYS ER 20 MEQ PO TBCR
40.0000 meq | EXTENDED_RELEASE_TABLET | Freq: Two times a day (BID) | ORAL | Status: DC
  Administered 2013-11-03 – 2013-11-09 (×13): 40 meq via ORAL
  Filled 2013-11-03 (×12): qty 2

## 2013-11-03 MED ORDER — VANCOMYCIN HCL 10 G IV SOLR
1500.0000 mg | Freq: Two times a day (BID) | INTRAVENOUS | Status: DC
  Administered 2013-11-03 – 2013-11-09 (×12): 1500 mg via INTRAVENOUS
  Filled 2013-11-03 (×15): qty 1500

## 2013-11-03 NOTE — Progress Notes (Signed)
Subjective: Patient is resting. She is progressively improving. Her headache is better. Her CSf is negative for HSV DNA. Culture so far showed no growth. Vital signs in last 24 hours: Temp:  [98.7 F (37.1 C)] 98.7 F (37.1 C) (02/10 2000) Resp:  [15-36] 28 (02/11 0200) BP: (107-143)/(53-82) 117/73 mmHg (02/11 0200) Weight change:  Last BM Date: 10/29/13  Intake/Output from previous day: 02/10 0701 - 02/11 0700 In: 2881.7 [P.O.:840; I.V.:1491.7; IV Piggyback:550] Out: -   PHYSICAL EXAM General appearance: alert and no distress Resp: clear to auscultation bilaterally Cardio: S1, S2 normal GI: soft, non-tender; bowel sounds normal; no masses,  no organomegaly Extremities: extremities normal, atraumatic, no cyanosis or edema  Lab Results:    @labtest @ ABGS No results found for this basename: PHART, PCO2, PO2ART, TCO2, HCO3,  in the last 72 hours CULTURES Recent Results (from the past 240 hour(s))  CULTURE, BLOOD (ROUTINE X 2)     Status: None   Collection Time    10/31/13  4:08 PM      Result Value Ref Range Status   Specimen Description BLOOD LEFT ARM DRAWN BY RN LCT   Final   Special Requests BOTTLES DRAWN AEROBIC ONLY 6CC   Final   Culture NO GROWTH 2 DAYS   Final   Report Status PENDING   Incomplete  CULTURE, BLOOD (ROUTINE X 2)     Status: None   Collection Time    10/31/13  4:20 PM      Result Value Ref Range Status   Specimen Description BLOOD RIGHT HAND   Final   Special Requests BOTTLES DRAWN AEROBIC AND ANAEROBIC 8CC   Final   Culture NO GROWTH 2 DAYS   Final   Report Status PENDING   Incomplete  MRSA PCR SCREENING     Status: None   Collection Time    11/01/13  3:00 AM      Result Value Ref Range Status   MRSA by PCR NEGATIVE  NEGATIVE Final   Comment:            The GeneXpert MRSA Assay (FDA     approved for NASAL specimens     only), is one component of a     comprehensive MRSA colonization     surveillance program. It is not     intended to  diagnose MRSA     infection nor to guide or     monitor treatment for     MRSA infections.  CSF CULTURE     Status: None   Collection Time    11/01/13  2:25 PM      Result Value Ref Range Status   Specimen Description CSF   Final   Special Requests NONE   Final   Gram Stain     Final   Value: CYTOSPIN WBC PRESENT,BOTH PMN AND MONONUCLEAR     NO ORGANISMS SEEN     Performed at [facility]   Culture     Final   Value: NO GROWTH     Performed at [facility]   Report Status PENDING   Incomplete   Studies/Results: Mr Brain Wo Contrast  11/01/2013   CLINICAL DATA:  Altered mental status, headache, weakness  EXAM: MRI HEAD WITHOUT CONTRAST  TECHNIQUE: Multiplanar, multiecho pulse sequences of the brain and surrounding structures were obtained without intravenous contrast.  COMPARISON:  Prior CT from 10/31/2013  FINDINGS: Study is degraded by motion artifact.  Multiple foci of abnormal restricted diffusion  are seen involving the periventricular and deep white matter of the centrum semi ovales bilaterally. Corresponding signal drop out seen on the [facility] map. For reference purposes, the largest of these on the left measures approximately 9 mm and is located in the left frontal lobe (series 10, image 38). The largest of these lesions on the right measures 6 mm and is located in the right frontal lobe (series 10, image 37). These are localized predominantly within the frontal lobes along the junction of the superficial and deep perforator vessels. There is question of a small focus of restricted diffusion within the left temporal lobe (series 10, image 33). No infratentorial infarcts identified. No intracranial hemorrhage is seen.  Scattered and confluent T2/FLAIR hyperintensity within the periventricular white matter most compatible with mild chronic microvascular ischemic disease.  No mass lesion, mass effect, or midline shift. Ventricles are normal in size without evidence of  hydrocephalus. No extra-axial fluid collection.  Calvarium is intact. Craniocervical junction is not well evaluated. Pituitary gland and orbits are not well evaluated as well.  IMPRESSION: Multi focal subcentimeter ischemic infarcts involving the periventricular and deep white matter of both cerebral hemispheres as above. These foci are largely in a watershed distribution, and may be related to underlying hypotension or prior hypotensive episode. No definite signal changes involving the temporal lobes bilaterally to suggest herpes encephalitis, although evaluation is limited due to extensive motion artifact.   Electronically Signed   By: Jeannine Boga M.D.   On: 11/01/2013 21:32   Dg Fluoro Guide Lumbar Puncture  11/01/2013   CLINICAL DATA:  Headache, fever  EXAM: DIAGNOSTIC LUMBAR PUNCTURE UNDER FLUOROSCOPIC GUIDANCE  FLUOROSCOPY TIME:  0 min 36 seconds  PROCEDURE: Procedure, benefits, and risks were discussed with the patient, including alternatives.  Patient's questions were answered.  Written informed consent was obtained.  Timeout protocol followed.  Patient placed prone.  L4-L5 disc space was localized under fluoroscopy.  Skin prepped and draped in usual sterile fashion.  Skin and soft tissues anesthetized with 1.5 mL of 1% lidocaine.  22 gauge needle was advanced into the spinal canal where clear colorless CSF was encountered with an opening pressure of 3 cm H2O (prone).  9 mL of CSF was obtained in 4 tubes for requested analysis.  Procedure tolerated very well by patient without immediate complication.  IMPRESSION: Fluoroscopic guided lumbar puncture as above.   Electronically Signed   By: Lavonia Dana M.D.   On: 11/01/2013 15:13    Medications: I have reviewed the patient's current medications.  Assesment: Principal Problem:   Fever Active Problems:   Acute renal failure   Headache   Hypotension   Altered mental state    Plan: Will continue Iv antibiotics Neurology consult  appreciated Continue current treatment      LOS: 3 days   Odesser Tourangeau 11/03/2013, 8:15 AM

## 2013-11-03 NOTE — Progress Notes (Signed)
Patient ID: Alison Elliott, female   DOB: November 06, 1960, 53 y.o.   MRN: [id number]  Alison A. Merlene Laughter, MD     [email or web address]          Alison Elliott is an 53 y.o. female.   Assessment/Plan: The presentation of the patient's symptoms ( AMS, slow speech fever and headaches )are worrisome for CNS infection. The patient's CSF analysis reviewed along with the MRI findings and the concern that she could have a partially treated bacterial meningitis or even still a viral encephalitis.   1. Likely partially treated bacterial meningitis. The patient has improved with the current regimen. Given that the herpes PCR from his bottle is negative, I agree with discontinuation of acyclovir. Continue with the vancomycin and Rocephin.    Watershed infarct. Likely due to hypovolemia or changes from encephalitis. Obviously not candidate for TPA due to because of the therapy when there are and the overall medical conditions. May consider aspirin therapy once we have a clearer picture of the patient's clinical situation.      GENERAL: This is an obese lady in no acute distress. She is drowsy and sleepy throughout the evaluation.  HEENT: Neck is supple. She does have some dizziness on range of motion but again, the neck seems supple.  ABDOMEN: soft  EXTREMITIES: No edema  BACK: Unremarkable.  SKIN: Normal by inspection.  MENTAL STATUS: Today she is awake and alert. She is oriented to year month and date. She follows commands well and speech is normal.  CRANIAL NERVES: Pupils are equal, round and reactive to light and accommodation; extra ocular movements are full, there is no significant nystagmus; visual fields are full; upper and lower facial muscles are normal in strength and symmetric, there is no flattening of the nasolabial folds; tongue is midline; uvula is midline; shoulder elevation is normal. Attempted funduscopic examination shows that this appears to be flat but very limited due  to lack of cooperation.  MOTOR: Normal tone, bulk and strength; no pronator drift.  COORDINATION: Left finger to nose is normal, right finger to nose is normal, No rest tremor; no intention tremor; no postural tremor; no bradykinesia.  REFLEXES: Deep tendon reflexes are symmetrical and normal except at the knees where they are quite diminished but normal at the ankles. Plantar responses are flexor bilaterally.  SENSATION: Normal to light touch.        Objective: Vital signs in last 24 hours: Temp:  [98.7 F (37.1 C)] 98.7 F (37.1 C) (02/10 2000) Resp:  [15-36] 28 (02/11 0700) BP: (107-143)/(53-82) 125/65 mmHg (02/11 0700)  Intake/Output from previous day: 02/10 0701 - 02/11 0700 In: 2881.7 [P.O.:840; I.V.:1491.7; IV Piggyback:550] Out: -  Intake/Output this shift:   Nutritional status: Sodium Restricted   Lab Results: Results for orders placed during the hospital encounter of 10/31/13 (from the past 48 hour(s))  PROTEIN AND GLUCOSE, CSF     Status: Abnormal   Collection Time    11/01/13  2:25 PM      Result Value Ref Range   Glucose, CSF 56  43 - 76 mg/dL   Total  Protein, CSF 66 (*) 15 - 45 mg/dL  CSF CULTURE     Status: None   Collection Time    11/01/13  2:25 PM      Result Value Ref Range   Specimen Description CSF     Special Requests NONE     Gram Stain       Value: CYTOSPIN WBC  PRESENT,BOTH PMN AND MONONUCLEAR     NO ORGANISMS SEEN     Performed at [facility]   Culture       Value: NO GROWTH     Performed at [facility]   Report Status PENDING    CSF CELL COUNT WITH DIFFERENTIAL     Status: Abnormal   Collection Time    11/01/13  2:25 PM      Result Value Ref Range   Tube # 3     Color, CSF COLORLESS  COLORLESS   Appearance, CSF CLEAR  CLEAR   Supernatant NOT INDICATED     RBC Count, CSF 23 (*) 0 /cu mm   WBC, CSF 58 (*) 0 - 5 /cu mm   Comment: CRITICAL RESULT CALLED TO, READ BACK BY AND VERIFIED WITH:     CHILDRESS,J. AT 1640 ON  11/01/2013 BY BAUGHAM,M.   Segmented Neutrophils-CSF 24 (*) 0 - 6 %   Lymphs, CSF 65  40 - 80 %   Monocyte-Macrophage-Spinal Fluid 11 (*) 15 - 45 %   Eosinophils, CSF 0  0 - 1 %   Other Cells, CSF PENDING PATHOLOGIST REVIEW    CRYPTOCOCCAL ANTIGEN, CSF     Status: None   Collection Time    11/01/13  2:25 PM      Result Value Ref Range   Crypto Ag NEGATIVE  NEGATIVE   Cryptococcal Ag Titer NOT INDICATED  NOT INDICATED   Comment: Performed at [facility] VIRUS(HSV) DNA BY PCR     Status: None   Collection Time    11/01/13  2:25 PM      Result Value Ref Range   Specimen source hsv CSF     HSV 1 DNA Not Detected  Not Detected   HSV 2 DNA Not Detected  Not Detected   Comment: (NOTE)     Note:     This assay detects the presence of herpes simplex virus (HSV) DNA by     real-time polymerase chain reaction (PCR) amplification of the virus     polymerase gene.  A result "DETECTED" is reported if a fluorescent     signal for HSV DNA is detected. If HSV DNA is present, the virus is     further characterized into subtype 1 or subtype 2 according     type-specific differences in melting temperature. The assay is     performed in the presence of an internal PCR control to ensure     efficient sample extraction and the absence of PCR inhibitors in the     sample.     This test was developed and its performance characteristics have been     determined by [facility]. Performance characteristics refer     to the analytical performance of the test. This test has not been     cleared or approved by the [place] Food and Drug Administration. The FDA     has determined that such clearance or approval is not necessary. This     laboratory is certified under the [employer] as qualified to perform high complexity clinical     laboratory testing.     Performed at [facility] REVIEW     Status: None    Collection Time    11/01/13  2:25 PM      Result Value Ref Range   Path Review  Reviewed By Violet Baldy, M.D.     Comment: [id number]     REACTIVE LYMPHOCYTES PRESENT     Performed at [facility]  CBC     Status: None   Collection Time    11/02/13  5:04 AM      Result Value Ref Range   WBC 6.4  4.0 - 10.5 K/uL   RBC 4.60  3.87 - 5.11 MIL/uL   Hemoglobin 12.6  12.0 - 15.0 g/dL   HCT 39.0  36.0 - 46.0 %   MCV 84.8  78.0 - 100.0 fL   MCH 27.4  26.0 - 34.0 pg   MCHC 32.3  30.0 - 36.0 g/dL   RDW 14.5  11.5 - 15.5 %   Platelets 273  150 - 400 K/uL  BASIC METABOLIC PANEL     Status: Abnormal   Collection Time    11/02/13  5:04 AM      Result Value Ref Range   Sodium 141  137 - 147 mEq/L   Potassium 3.4 (*) 3.7 - 5.3 mEq/L   Chloride 102  96 - 112 mEq/L   CO2 26  19 - 32 mEq/L   Glucose, Bld 100 (*) 70 - 99 mg/dL   BUN 10  6 - 23 mg/dL   Creatinine, Ser 0.94  0.50 - 1.10 mg/dL   Comment: DELTA CHECK NOTED   Calcium 9.0  8.4 - 10.5 mg/dL   GFR calc non Af Amer 69 (*) >90 mL/min   GFR calc Af Amer 79 (*) >90 mL/min   Comment: (NOTE)     The eGFR has been calculated using the CKD EPI equation.     This calculation has not been validated in all clinical situations.     eGFR's persistently <90 mL/min signify possible Chronic Kidney     Disease.  BASIC METABOLIC PANEL     Status: Abnormal   Collection Time    11/03/13  5:19 AM      Result Value Ref Range   Sodium 142  137 - 147 mEq/L   Potassium 3.1 (*) 3.7 - 5.3 mEq/L   Chloride 104  96 - 112 mEq/L   CO2 25  19 - 32 mEq/L   Glucose, Bld 97  70 - 99 mg/dL   BUN 6  6 - 23 mg/dL   Creatinine, Ser 0.94  0.50 - 1.10 mg/dL   Calcium 8.8  8.4 - 10.5 mg/dL   GFR calc non Af Amer 69 (*) >90 mL/min   GFR calc Af Amer 79 (*) >90 mL/min   Comment: (NOTE)     The eGFR has been calculated using the CKD EPI equation.     This calculation has not been validated in all clinical situations.     eGFR's persistently <90  mL/min signify possible Chronic Kidney     Disease.  CBC     Status: None   Collection Time    11/03/13  5:19 AM      Result Value Ref Range   WBC 7.1  4.0 - 10.5 K/uL   RBC 4.69  3.87 - 5.11 MIL/uL   Hemoglobin 12.7  12.0 - 15.0 g/dL   HCT 39.4  36.0 - 46.0 %   MCV 84.0  78.0 - 100.0 fL   MCH 27.1  26.0 - 34.0 pg   MCHC 32.2  30.0 - 36.0 g/dL   RDW 14.5  11.5 - 15.5 %   Platelets 271  150 -  400 K/uL    Lipid Panel No results found for this basename: CHOL, TRIG, HDL, CHOLHDL, VLDL, LDLCALC,  in the last 72 hours  Studies/Results: Mr Brain Wo Contrast  11/01/2013   CLINICAL DATA:  Altered mental status, headache, weakness  EXAM: MRI HEAD WITHOUT CONTRAST  TECHNIQUE: Multiplanar, multiecho pulse sequences of the brain and surrounding structures were obtained without intravenous contrast.  COMPARISON:  Prior CT from 10/31/2013  FINDINGS: Study is degraded by motion artifact.  Multiple foci of abnormal restricted diffusion are seen involving the periventricular and deep white matter of the centrum semi ovales bilaterally. Corresponding signal drop out seen on the [facility] map. For reference purposes, the largest of these on the left measures approximately 9 mm and is located in the left frontal lobe (series 10, image 38). The largest of these lesions on the right measures 6 mm and is located in the right frontal lobe (series 10, image 37). These are localized predominantly within the frontal lobes along the junction of the superficial and deep perforator vessels. There is question of a small focus of restricted diffusion within the left temporal lobe (series 10, image 33). No infratentorial infarcts identified. No intracranial hemorrhage is seen.  Scattered and confluent T2/FLAIR hyperintensity within the periventricular white matter most compatible with mild chronic microvascular ischemic disease.  No mass lesion, mass effect, or midline shift. Ventricles are normal in size without evidence of  hydrocephalus. No extra-axial fluid collection.  Calvarium is intact. Craniocervical junction is not well evaluated. Pituitary gland and orbits are not well evaluated as well.  IMPRESSION: Multi focal subcentimeter ischemic infarcts involving the periventricular and deep white matter of both cerebral hemispheres as above. These foci are largely in a watershed distribution, and may be related to underlying hypotension or prior hypotensive episode. No definite signal changes involving the temporal lobes bilaterally to suggest herpes encephalitis, although evaluation is limited due to extensive motion artifact.   Electronically Signed   By: Jeannine Boga M.D.   On: 11/01/2013 21:32   Dg Fluoro Guide Lumbar Puncture  11/01/2013   CLINICAL DATA:  Headache, fever  EXAM: DIAGNOSTIC LUMBAR PUNCTURE UNDER FLUOROSCOPIC GUIDANCE  FLUOROSCOPY TIME:  0 min 36 seconds  PROCEDURE: Procedure, benefits, and risks were discussed with the patient, including alternatives.  Patient's questions were answered.  Written informed consent was obtained.  Timeout protocol followed.  Patient placed prone.  L4-L5 disc space was localized under fluoroscopy.  Skin prepped and draped in usual sterile fashion.  Skin and soft tissues anesthetized with 1.5 mL of 1% lidocaine.  22 gauge needle was advanced into the spinal canal where clear colorless CSF was encountered with an opening pressure of 3 cm H2O (prone).  9 mL of CSF was obtained in 4 tubes for requested analysis.  Procedure tolerated very well by patient without immediate complication.  IMPRESSION: Fluoroscopic guided lumbar puncture as above.   Electronically Signed   By: Lavonia Dana M.D.   On: 11/01/2013 15:13    Medications:  Scheduled Meds: . cefTRIAXone (ROCEPHIN)  IV  2 g Intravenous Q12H  . potassium chloride  40 mEq Oral BID  . sodium chloride  1,000 mL Intravenous Once  . sodium chloride  1,000 mL Intravenous Once  . vancomycin  1,000 mg Intravenous Q12H    Continuous Infusions: . sodium chloride 125 mL/hr at 11/03/13 0836   PRN Meds:.acetaminophen     LOS: 3 days   Clayton Jarmon A. Merlene Elliott, M.D.  Diplomate, Tax adviser of Psychiatry and  Neurology ( Neurology).

## 2013-11-03 NOTE — Progress Notes (Signed)
ANTIBIOTIC CONSULT NOTE  Pharmacy Consult for vancomycin (with Rocephin) Indication: CNS infection - R/o bacterial meningitis  No Known Allergies  Patient Measurements: Height: 5\' 3"  (160 cm) Weight: 251 lb 15.8 oz (114.3 kg) IBW/kg (Calculated) : 52.4  Vital Signs: Temp: 98.9 F (37.2 C) (02/11 0800) Temp src: Oral (02/11 0800) BP: 128/78 mmHg (02/11 0900) Intake/Output from previous day: 02/10 0701 - 02/11 0700 In: 2881.7 [P.O.:840; I.V.:1491.7; IV Piggyback:550] Out: -  Intake/Output from this shift:    Labs:  Recent Labs  11/01/13 0512 11/02/13 0504 11/03/13 0519  WBC 8.5 6.4 7.1  HGB 12.8 12.6 12.7  PLT 287 273 271  CREATININE 1.50* 0.94 0.94   Estimated Creatinine Clearance: 85.3 ml/min (by C-G formula based on Cr of 0.94). No results found for this basename: VANCOTROUGH, Corlis Leak, VANCORANDOM, GENTTROUGH, GENTPEAK, GENTRANDOM, TOBRATROUGH, TOBRAPEAK, TOBRARND, AMIKACINPEAK, AMIKACINTROU, AMIKACIN,  in the last 72 hours   Microbiology: Recent Results (from the past 720 hour(s))  CULTURE, BLOOD (ROUTINE X 2)     Status: None   Collection Time    10/31/13  4:08 PM      Result Value Ref Range Status   Specimen Description BLOOD LEFT ARM DRAWN BY RN LCT   Final   Special Requests BOTTLES DRAWN AEROBIC ONLY 6CC   Final   Culture NO GROWTH 3 DAYS   Final   Report Status PENDING   Incomplete  CULTURE, BLOOD (ROUTINE X 2)     Status: None   Collection Time    10/31/13  4:20 PM      Result Value Ref Range Status   Specimen Description BLOOD RIGHT HAND   Final   Special Requests BOTTLES DRAWN AEROBIC AND ANAEROBIC 8CC   Final   Culture NO GROWTH 3 DAYS   Final   Report Status PENDING   Incomplete  MRSA PCR SCREENING     Status: None   Collection Time    11/01/13  3:00 AM      Result Value Ref Range Status   MRSA by PCR NEGATIVE  NEGATIVE Final   Comment:            The GeneXpert MRSA Assay (FDA     approved for NASAL specimens     only), is one component  of a     comprehensive MRSA colonization     surveillance program. It is not     intended to diagnose MRSA     infection nor to guide or     monitor treatment for     MRSA infections.  CSF CULTURE     Status: None   Collection Time    11/01/13  2:25 PM      Result Value Ref Range Status   Specimen Description CSF   Final   Special Requests NONE   Final   Gram Stain     Final   Value: CYTOSPIN WBC PRESENT,BOTH PMN AND MONONUCLEAR     NO ORGANISMS SEEN     Performed at [facility]   Culture     Final   Value: NO GROWTH     Performed at [facility]   Report Status PENDING   Incomplete    Medical History: Past Medical History  Diagnosis Date  . Hypertension     Medications:  Scheduled:  . cefTRIAXone (ROCEPHIN)  IV  2 g Intravenous Q12H  . potassium chloride  40 mEq Oral BID  . sodium chloride  1,000 mL Intravenous Once  .  sodium chloride  1,000 mL Intravenous Once  . vancomycin  1,500 mg Intravenous Q12H   Infusions:  . sodium chloride 125 mL/hr at 11/03/13 3546   Assessment: 53yo obese female with HA/CNS sx's for 5 days complicated by acute renal failure on admission.  She has been started on empiric treatment for meningitis.   SCr has improved since admission.  Estimated Creatinine Clearance: 85.3 ml/min (by C-G formula based on Cr of 0.94). Pt is currently afebrile. WBC is trending down.    Vancomycin 2/8>> Rocephin 2/8>>  Goal of Therapy:  Vancomycin trough 15-20 mcg/ml  Plan:  1.  Change Vancomycin to 1500mg  IV q12h (SCR improved) 2.  Check Vancomycin trough at steady state 3.  Continue Rocephin 2gm IV q12h 4.  Monitor renal function, cx data, & LP results  Hart Robinsons A 11/03/2013,12:39 PM

## 2013-11-03 NOTE — Care Management Note (Addendum)
    Page 1 of 1   11/09/2013     10:25:33 AM   CARE MANAGEMENT NOTE 11/09/2013  Patient:  Alison Elliott, Alison Elliott   Account Number:  [id number]  Date Initiated:  11/03/2013  Documentation initiated by:  Theophilus Kinds  Subjective/Objective Assessment:   Pt admitted from home with AMS. Pt lives with family and will return home at discharge. Pt has a large family that is very active in the care of the pt. Pt is independent with ADL's.     Action/Plan:   No CM needs noted at this time.   Anticipated DC Date:  11/06/2013   Anticipated DC Plan:  [place]  CM consult      Choice offered to / List presented to:             Status of service:  Completed, signed off Medicare Important Message given?   (If response is "NO", the following Medicare IM given date fields will be blank) Date Medicare IM given:   Date Additional Medicare IM given:    Discharge Disposition:  HOME/SELF CARE  Per UR Regulation:    If discussed at Long Length of Stay Meetings, dates discussed:    Comments:  11/09/13 [place], RN BSN CM Pt discharged home today. No CM needs noted. Pts daughter plans to stay with pt as needed.  11/03/13 [place], RN BSN CM  11/04/13 Amy Robson RN BNS CM Pt previously with [facility]. If HH needed, would like to resume with them.  11/05/13 Claretha Cooper RN BSN CM Discussed [place] RN and IV AB. Alerted [facility] of DC with IV AB. 3:00 pm. Spoke with pt about home IV AB, no PICC in place. Per PACU, no on call PICC RN available this weekend. Left VM for Dr. Luan Pulling.

## 2013-11-03 NOTE — Progress Notes (Signed)
EEG Completed; Results Pending  

## 2013-11-04 ENCOUNTER — Inpatient Hospital Stay (HOSPITAL_COMMUNITY): Payer: BC Managed Care – PPO

## 2013-11-04 DIAGNOSIS — I059 Rheumatic mitral valve disease, unspecified: Secondary | ICD-10-CM

## 2013-11-04 MED ORDER — HYDROCHLOROTHIAZIDE 12.5 MG PO CAPS
12.5000 mg | ORAL_CAPSULE | Freq: Every day | ORAL | Status: DC
  Administered 2013-11-04 – 2013-11-05 (×2): 12.5 mg via ORAL
  Filled 2013-11-04 (×2): qty 1

## 2013-11-04 MED ORDER — LISINOPRIL 10 MG PO TABS
10.0000 mg | ORAL_TABLET | Freq: Every day | ORAL | Status: DC
  Administered 2013-11-04 – 2013-11-05 (×2): 10 mg via ORAL
  Filled 2013-11-04 (×2): qty 1

## 2013-11-04 MED ORDER — LABETALOL HCL 5 MG/ML IV SOLN
INTRAVENOUS | Status: AC
  Administered 2013-11-04: 5 mg
  Filled 2013-11-04: qty 4

## 2013-11-04 MED ORDER — ASPIRIN 325 MG PO TABS
325.0000 mg | ORAL_TABLET | Freq: Every day | ORAL | Status: DC
  Administered 2013-11-04 – 2013-11-08 (×5): 325 mg via ORAL
  Filled 2013-11-04 (×5): qty 1

## 2013-11-04 NOTE — Procedures (Signed)
  [place] A. Merlene Laughter, MD     [email or web address]           HISTORY: The patient is a 53 year old female who presented on a mat status. Study is being done to evaluate for possible seizures.  MEDICATIONS: Scheduled Meds: . aspirin  325 mg Oral Daily  . cefTRIAXone (ROCEPHIN)  IV  2 g Intravenous Q12H  . potassium chloride  40 mEq Oral BID  . sodium chloride  1,000 mL Intravenous Once  . sodium chloride  1,000 mL Intravenous Once  . vancomycin  1,500 mg Intravenous Q12H   Continuous Infusions: . sodium chloride 125 mL/hr at 11/04/13 0514   PRN Meds:.acetaminophen   ANALYSIS: 16 channel recording using standard 10 20 measurements 21 minutes. There is a well-formed posterior dominant rhythm of 12 Hz which attenuates with eye opening. There is beta activity observed in the frontal areas. Photic stimulation and hyperventilation were not carried out. Awake and sleep activities are observed. K complexes and sleep spindles are recorded. There is no focal or lateralized slowing. There is no epileptiform activity observed.   IMPRESSION: This recording is essentially normal.      Kalin Kyler A. Merlene Laughter, M.D.  Diplomate, Tax adviser of Psychiatry and Neurology ( Neurology).

## 2013-11-04 NOTE — Progress Notes (Signed)
Subjective: Patient is feeling better. Her headache is less. No fever or chills. She is receiving combination iv antibiotics. Vital signs in last 24 hours: Temp:  [97.9 F (36.6 C)-98.9 F (37.2 C)] 97.9 F (36.6 C) (02/12 0400) Pulse Rate:  [29-98] 98 (02/12 0600) Resp:  [15-33] 30 (02/12 0600) BP: (126-159)/(72-102) 159/95 mmHg (02/12 0600) SpO2:  [75 %-100 %] 98 % (02/12 0600) Weight:  [117.8 kg (259 lb 11.2 oz)] 117.8 kg (259 lb 11.2 oz) (02/12 0500) Weight change:  Last BM Date: 11/04/13  Intake/Output from previous day: 02/11 0701 - 02/12 0700 In: 4456.3 [I.V.:2906.3; IV Piggyback:1550] Out: 300 [Urine:300]  PHYSICAL EXAM General appearance: alert and no distress Resp: clear to auscultation bilaterally Cardio: S1, S2 normal GI: soft, non-tender; bowel sounds normal; no masses,  no organomegaly Extremities: extremities normal, atraumatic, no cyanosis or edema  Lab Results:    @labtest @ ABGS No results found for this basename: PHART, PCO2, PO2ART, TCO2, HCO3,  in the last 72 hours CULTURES Recent Results (from the past 240 hour(s))  CULTURE, BLOOD (ROUTINE X 2)     Status: None   Collection Time    10/31/13  4:08 PM      Result Value Ref Range Status   Specimen Description BLOOD LEFT ARM DRAWN BY RN LCT   Final   Special Requests BOTTLES DRAWN AEROBIC ONLY 6CC   Final   Culture NO GROWTH 3 DAYS   Final   Report Status PENDING   Incomplete  CULTURE, BLOOD (ROUTINE X 2)     Status: None   Collection Time    10/31/13  4:20 PM      Result Value Ref Range Status   Specimen Description BLOOD RIGHT HAND   Final   Special Requests BOTTLES DRAWN AEROBIC AND ANAEROBIC 8CC   Final   Culture NO GROWTH 3 DAYS   Final   Report Status PENDING   Incomplete  MRSA PCR SCREENING     Status: None   Collection Time    11/01/13  3:00 AM      Result Value Ref Range Status   MRSA by PCR NEGATIVE  NEGATIVE Final   Comment:            The GeneXpert MRSA Assay (FDA     approved  for NASAL specimens     only), is one component of a     comprehensive MRSA colonization     surveillance program. It is not     intended to diagnose MRSA     infection nor to guide or     monitor treatment for     MRSA infections.  CSF CULTURE     Status: None   Collection Time    11/01/13  2:25 PM      Result Value Ref Range Status   Specimen Description CSF   Final   Special Requests NONE   Final   Gram Stain     Final   Value: CYTOSPIN WBC PRESENT,BOTH PMN AND MONONUCLEAR     NO ORGANISMS SEEN     Performed at [facility]   Culture     Final   Value: NO GROWTH 1 DAY     Performed at [facility]   Report Status PENDING   Incomplete   Studies/Results: No results found.  Medications: I have reviewed the patient's current medications.  Assesment: Principal Problem:   Fever Active Problems:   Acute renal failure   Headache  Hypotension   Altered mental state    Plan: Will continue Iv antibiotics Continue current treatment      LOS: 4 days   Nhung Danko 11/04/2013, 8:04 AM

## 2013-11-04 NOTE — Progress Notes (Signed)
Report called to Rodman Key, RN. Patient transferred to [id number] via wheelchair in stable condition.

## 2013-11-04 NOTE — Progress Notes (Signed)
*  PRELIMINARY RESULTS* Echocardiogram 2D Echocardiogram has been performed.  [place], [place] 11/04/2013, 11:08 AM

## 2013-11-04 NOTE — Progress Notes (Signed)
Dr. Legrand Rams notified of patients situation. Patient SOB has resolved and BPs back down to baseline. Patient states she feels better. Told patient to call if symptoms return. Dr. Legrand Rams in agreement that if patient is stable and not symptomatic that she should remain on 300 as a telemetry patient. Patients care handed off to [facility]

## 2013-11-04 NOTE — Plan of Care (Signed)
Pt was given telephone orders to go to ICU.  Marshall Cork, RN from ICU, came up to floor to assess pt and confirmed that pt had returned to baseline (since pt had just come from ICU this morning).  Dr. Legrand Rams was called was informed - then gave verbal orders to DC transfer for now.  Pt placed on Q4 vitals. Will continue to monitor.

## 2013-11-04 NOTE — Progress Notes (Signed)
Patient ID: Alison Elliott, female   DOB: 1960/11/03, 53 y.o.   MRN: [id number]  [place] A. Merlene Laughter, MD     [email or web address]          Alison Elliott is an 53 y.o. female.   Assessment/Plan:   1. Likely partially treated bacterial meningitis. The patient has improved with the current regimen. Given that the herpes PCR from his bottle is negative, I agree with discontinuation of acyclovir. Continue with the vancomycin and Rocephin.   Watershed infarct. Likely due to hypovolemia/hypotension/sepsis. Obviously not candidate for TPA due to because of the therapy when there are and the overall medical conditions. Get EHCO, CAROTID DOP and start ASA.  She indicated that she continues to improve. She looks a lot more bright and responsive today. She still has frontal headaches. This is likely due to the bacteria meningitis which should improve. She has been on antibiotics now for 4 days and will be taken off isolation.  GENERAL: This is an obese lady in no acute distress. She is drowsy and sleepy throughout the evaluation.  HEENT: Neck is supple. She does have some dizziness on range of motion but again, the neck seems supple.  ABDOMEN: soft  EXTREMITIES: No edema  BACK: Unremarkable.  SKIN: Normal by inspection.  MENTAL STATUS: Today she is awake and alert. She is oriented to year month and date. She follows commands well and speech is normal.  CRANIAL NERVES: Pupils are equal, round and reactive to light and accommodation; extra ocular movements are full, there is no significant nystagmus; visual fields are full; upper and lower facial muscles are normal in strength and symmetric, there is no flattening of the nasolabial folds; tongue is midline; uvula is midline; shoulder elevation is normal. Attempted funduscopic examination shows that this appears to be flat but very limited due to lack of cooperation.  MOTOR: Normal tone, bulk and strength; no pronator drift.    COORDINATION: Left finger to nose is normal, right finger to nose is normal, No rest tremor; no intention tremor; no postural tremor; no bradykinesia.    EEG normal.    Objective: Vital signs in last 24 hours: Temp:  [97.9 F (36.6 C)-98.9 F (37.2 C)] 97.9 F (36.6 C) (02/12 0400) Pulse Rate:  [29-98] 98 (02/12 0600) Resp:  [15-33] 30 (02/12 0600) BP: (126-159)/(72-102) 159/95 mmHg (02/12 0600) SpO2:  [75 %-100 %] 98 % (02/12 0600) Weight:  [117.8 kg (259 lb 11.2 oz)] 117.8 kg (259 lb 11.2 oz) (02/12 0500)  Intake/Output from previous day: 02/11 0701 - 02/12 0700 In: 4456.3 [I.V.:2906.3; IV Piggyback:1550] Out: 300 [Urine:300] Intake/Output this shift:   Nutritional status: Sodium Restricted   Lab Results: Results for orders placed during the hospital encounter of 10/31/13 (from the past 48 hour(s))  BASIC METABOLIC PANEL     Status: Abnormal   Collection Time    11/03/13  5:19 AM      Result Value Ref Range   Sodium 142  137 - 147 mEq/L   Potassium 3.1 (*) 3.7 - 5.3 mEq/L   Chloride 104  96 - 112 mEq/L   CO2 25  19 - 32 mEq/L   Glucose, Bld 97  70 - 99 mg/dL   BUN 6  6 - 23 mg/dL   Creatinine, Ser 0.94  0.50 - 1.10 mg/dL   Calcium 8.8  8.4 - 10.5 mg/dL   GFR calc non Af Amer 69 (*) >90 mL/min   GFR calc Af Amer 79 (*) >  90 mL/min   Comment: (NOTE)     The eGFR has been calculated using the CKD EPI equation.     This calculation has not been validated in all clinical situations.     eGFR's persistently <90 mL/min signify possible Chronic Kidney     Disease.  CBC     Status: None   Collection Time    11/03/13  5:19 AM      Result Value Ref Range   WBC 7.1  4.0 - 10.5 K/uL   RBC 4.69  3.87 - 5.11 MIL/uL   Hemoglobin 12.7  12.0 - 15.0 g/dL   HCT 39.4  36.0 - 46.0 %   MCV 84.0  78.0 - 100.0 fL   MCH 27.1  26.0 - 34.0 pg   MCHC 32.2  30.0 - 36.0 g/dL   RDW 14.5  11.5 - 15.5 %   Platelets 271  150 - 400 K/uL    Lipid Panel No results found for this  basename: CHOL, TRIG, HDL, CHOLHDL, VLDL, LDLCALC,  in the last 72 hours  Studies/Results: No results found.  Medications:  Scheduled Meds: . cefTRIAXone (ROCEPHIN)  IV  2 g Intravenous Q12H  . potassium chloride  40 mEq Oral BID  . sodium chloride  1,000 mL Intravenous Once  . sodium chloride  1,000 mL Intravenous Once  . vancomycin  1,500 mg Intravenous Q12H   Continuous Infusions: . sodium chloride 125 mL/hr at 11/04/13 0514   PRN Meds:.acetaminophen     LOS: 4 days   Kodey Xue A. Merlene Elliott, M.D.  Diplomate, Tax adviser of Psychiatry and Neurology ( Neurology).

## 2013-11-04 NOTE — Plan of Care (Addendum)
Dr. Legrand Rams called about pt's BP 160/80 and no BP meds have been ordered.  Gave verbal orders to restart home BP meds.  Order placed. BP rechecked (1230) was 181/121.  Pt complaining of diff breathing and was diaphoretic and shaking. - 2L [place] placed on pt  Vitals: 98.1, 106 HR, 97% on 2L., Resp 26 and Dr. Legrand Rams paged again - waiting on return call.  Dr. Hulen Skains back and ordered Stat labetolol 5mg  IV push and transfer to ICU.  Pt will be going to Rm 10

## 2013-11-05 LAB — CBC
HCT: 39.5 % (ref 36.0–46.0)
Hemoglobin: 12.9 g/dL (ref 12.0–15.0)
MCH: 27.4 pg (ref 26.0–34.0)
MCHC: 32.7 g/dL (ref 30.0–36.0)
MCV: 83.9 fL (ref 78.0–100.0)
Platelets: 286 10*3/uL (ref 150–400)
RBC: 4.71 MIL/uL (ref 3.87–5.11)
RDW: 14.5 % (ref 11.5–15.5)
WBC: 7 10*3/uL (ref 4.0–10.5)

## 2013-11-05 LAB — BASIC METABOLIC PANEL
BUN: 5 mg/dL — ABNORMAL LOW (ref 6–23)
CO2: 22 mEq/L (ref 19–32)
Calcium: 9.1 mg/dL (ref 8.4–10.5)
Chloride: 110 mEq/L (ref 96–112)
Creatinine, Ser: 1.01 mg/dL (ref 0.50–1.10)
GFR calc Af Amer: 73 mL/min — ABNORMAL LOW (ref >90)
GFR calc non Af Amer: 63 mL/min — ABNORMAL LOW (ref >90)
Glucose, Bld: 95 mg/dL (ref 70–99)
Potassium: 3.8 mEq/L (ref 3.7–5.3)
Sodium: 144 mEq/L (ref 137–147)

## 2013-11-05 LAB — CULTURE, BLOOD (ROUTINE X 2)
Culture: NO GROWTH
Culture: NO GROWTH

## 2013-11-05 LAB — VANCOMYCIN, TROUGH: Vancomycin Tr: 18.3 ug/mL (ref 10.0–20.0)

## 2013-11-05 NOTE — Progress Notes (Signed)
Subjective: She was admitted with presumed bacterial meningitis. She says she feels better. She still has some headache. She has no other new complaints.  Objective: Vital signs in last 24 hours: Temp:  [97.5 F (36.4 C)-98.3 F (36.8 C)] 98.3 F (36.8 C) (02/13 0623) Pulse Rate:  [68-106] 96 (02/13 0623) Resp:  [20-26] 20 (02/13 0623) BP: (143-181)/(67-125) 162/102 mmHg (02/13 0808) SpO2:  [96 %-99 %] 96 % (02/13 0623) Weight change:  Last BM Date: 11/04/13  Intake/Output from previous day: 02/12 0701 - 02/13 0700 In: -  Out: 250 [Urine:250]  PHYSICAL EXAM General appearance: alert, cooperative and mild distress Resp: clear to auscultation bilaterally Cardio: regular rate and rhythm, S1, S2 normal, no murmur, click, rub or gallop GI: soft, non-tender; bowel sounds normal; no masses,  no organomegaly Extremities: extremities normal, atraumatic, no cyanosis or edema  Lab Results:    Basic Metabolic Panel:  Recent Labs  11/03/13 0519 11/05/13 0518  NA 142 144  K 3.1* 3.8  CL 104 110  CO2 25 22  GLUCOSE 97 95  BUN 6 5*  CREATININE 0.94 1.01  CALCIUM 8.8 9.1   Liver Function Tests: No results found for this basename: AST, ALT, ALKPHOS, BILITOT, PROT, ALBUMIN,  in the last 72 hours No results found for this basename: LIPASE, AMYLASE,  in the last 72 hours No results found for this basename: AMMONIA,  in the last 72 hours CBC:  Recent Labs  11/03/13 0519 11/05/13 0518  WBC 7.1 7.0  HGB 12.7 12.9  HCT 39.4 39.5  MCV 84.0 83.9  PLT 271 286   Cardiac Enzymes: No results found for this basename: CKTOTAL, CKMB, CKMBINDEX, TROPONINI,  in the last 72 hours BNP: No results found for this basename: PROBNP,  in the last 72 hours D-Dimer: No results found for this basename: DDIMER,  in the last 72 hours CBG: No results found for this basename: GLUCAP,  in the last 72 hours Hemoglobin A1C: No results found for this basename: HGBA1C,  in the last 72 hours Fasting  Lipid Panel: No results found for this basename: CHOL, HDL, LDLCALC, TRIG, CHOLHDL, LDLDIRECT,  in the last 72 hours Thyroid Function Tests: No results found for this basename: TSH, T4TOTAL, FREET4, T3FREE, THYROIDAB,  in the last 72 hours Anemia Panel: No results found for this basename: VITAMINB12, FOLATE, FERRITIN, TIBC, IRON, RETICCTPCT,  in the last 72 hours Coagulation: No results found for this basename: LABPROT, INR,  in the last 72 hours Urine Drug Screen: Drugs of Abuse     Component Value Date/Time   LABOPIA POSITIVE* 10/31/2013 2023   COCAINSCRNUR NONE DETECTED 10/31/2013 2023   LABBENZ NONE DETECTED 10/31/2013 2023   AMPHETMU NONE DETECTED 10/31/2013 2023   THCU NONE DETECTED 10/31/2013 2023   LABBARB NONE DETECTED 10/31/2013 2023    Alcohol Level: No results found for this basename: ETH,  in the last 72 hours Urinalysis: No results found for this basename: COLORURINE, APPERANCEUR, LABSPEC, PHURINE, GLUCOSEU, HGBUR, BILIRUBINUR, KETONESUR, PROTEINUR, UROBILINOGEN, NITRITE, LEUKOCYTESUR,  in the last 72 hours Misc. Labs:  ABGS No results found for this basename: PHART, PCO2, PO2ART, TCO2, HCO3,  in the last 72 hours CULTURES Recent Results (from the past 240 hour(s))  CULTURE, BLOOD (ROUTINE X 2)     Status: None   Collection Time    10/31/13  4:08 PM      Result Value Ref Range Status   Specimen Description BLOOD LEFT ARM DRAWN BY RN LCT  Final   Special Requests BOTTLES DRAWN AEROBIC ONLY 6CC   Final   Culture NO GROWTH 5 DAYS   Final   Report Status 11/05/2013 FINAL   Final  CULTURE, BLOOD (ROUTINE X 2)     Status: None   Collection Time    10/31/13  4:20 PM      Result Value Ref Range Status   Specimen Description BLOOD RIGHT HAND   Final   Special Requests BOTTLES DRAWN AEROBIC AND ANAEROBIC 8CC   Final   Culture NO GROWTH 5 DAYS   Final   Report Status 11/05/2013 FINAL   Final  MRSA PCR SCREENING     Status: None   Collection Time    11/01/13  3:00 AM       Result Value Ref Range Status   MRSA by PCR NEGATIVE  NEGATIVE Final   Comment:            The GeneXpert MRSA Assay (FDA     approved for NASAL specimens     only), is one component of a     comprehensive MRSA colonization     surveillance program. It is not     intended to diagnose MRSA     infection nor to guide or     monitor treatment for     MRSA infections.  CSF CULTURE     Status: None   Collection Time    11/01/13  2:25 PM      Result Value Ref Range Status   Specimen Description CSF   Final   Special Requests NONE   Final   Gram Stain     Final   Value: CYTOSPIN WBC PRESENT,BOTH PMN AND MONONUCLEAR     NO ORGANISMS SEEN     Performed at [facility]   Culture     Final   Value: NO GROWTH 2 DAYS     Performed at [facility]   Report Status PENDING   Incomplete   Studies/Results: US Carotid Duplex Bilateral  11/04/2013   CLINICAL DATA:  Stroke  EXAM: BILATERAL CAROTID DUPLEX ULTRASOUND  TECHNIQUE: Pearline Cables scale imaging, color Doppler and duplex ultrasound were performed of bilateral carotid and vertebral arteries in the neck.  COMPARISON:  Recent brain MRI 11/01/2013  FINDINGS: Technically challenging examination secondary to patient body habitus and irregular breathing/snoring.  Criteria: Quantification of carotid stenosis is based on velocity parameters that correlate the residual internal carotid diameter with NASCET-based stenosis levels, using the diameter of the distal internal carotid lumen as the denominator for stenosis measurement.  The following velocity measurements were obtained:  RIGHT  ICA:  62/25 cm/sec  CCA:  19/50 cm/sec  SYSTOLIC ICA/CCA RATIO:  9.32  DIASTOLIC ICA/CCA RATIO:  6.71  ECA:  76 cm/sec  LEFT  ICA:  93/36 cm/sec  CCA:  24/58 cm/sec  SYSTOLIC ICA/CCA RATIO:  1.1  DIASTOLIC ICA/CCA RATIO:  1.6  ECA:  64 cm/sec  RIGHT CAROTID ARTERY: No significant atherosclerotic plaque or evidence of stenosis.  RIGHT VERTEBRAL ARTERY:  Patent with normal  antegrade flow.  LEFT CAROTID ARTERY: No significant atherosclerotic plaque or evidence of stenosis.  LEFT VERTEBRAL ARTERY:  Patent with normal antegrade flow.  IMPRESSION: Unremarkable carotid ultrasound.   Electronically Signed   By: Jacqulynn Cadet M.D.   On: 11/04/2013 14:58    Medications:  Prior to Admission:  Prescriptions prior to admission  Medication Sig Dispense Refill  . DM-APAP-CPM (CORICIDIN HBP FLU) 15-500-2 MG TABS  Take 2 tablets by mouth every 6 (six) hours as needed. Cold/flu      . fluticasone (FLONASE) 50 MCG/ACT nasal spray Place 1 spray into both nostrils 2 (two) times daily.      Marland Kitchen HYDROcodone-acetaminophen (NORCO/VICODIN) 5-325 MG per tablet Take 1-2 tablets by mouth every 6 (six) hours as needed for moderate pain.  14 tablet  0  . lisinopril-hydrochlorothiazide (PRINZIDE,ZESTORETIC) 10-12.5 MG per tablet Take 1 tablet by mouth daily.      . ondansetron (ZOFRAN ODT) 4 MG disintegrating tablet Take 1 tablet (4 mg total) by mouth every 8 (eight) hours as needed.  12 tablet  1   Scheduled: . aspirin  325 mg Oral Daily  . cefTRIAXone (ROCEPHIN)  IV  2 g Intravenous Q12H  . lisinopril  10 mg Oral Daily   And  . hydrochlorothiazide  12.5 mg Oral Daily  . potassium chloride  40 mEq Oral BID  . sodium chloride  1,000 mL Intravenous Once  . sodium chloride  1,000 mL Intravenous Once  . vancomycin  1,500 mg Intravenous Q12H   Continuous: . sodium chloride 125 mL/hr at 11/05/13 [id number]   [id number]  Assesment: She was admitted with altered mental status headache and fever. She has an abnormal lumbar puncture results. She's been treated for bacterial meningitis and is improving. I discussed with her that she does not necessarily have to be in the hospital for the entire course of therapy and she is considering whether she wants to try to finish up her treatment at home Principal Problem:   Fever Active Problems:   Acute renal failure   Headache   Hypotension    Altered mental state    Plan: As above    LOS: 5 days   Talley Casco L 11/05/2013, 9:00 AM

## 2013-11-05 NOTE — Progress Notes (Signed)
ANTIBIOTIC CONSULT NOTE  Pharmacy Consult for vancomycin (with Rocephin) Indication: CNS infection - R/o bacterial meningitis  No Known Allergies  Patient Measurements: Height: 5\' 3"  (160 cm) Weight: 259 lb 11.2 oz (117.8 kg) IBW/kg (Calculated) : 52.4  Vital Signs: Temp: 98.3 F (36.8 C) (02/13 0623) Temp src: Oral (02/13 0623) BP: 166/106 mmHg (02/13 0623) Pulse Rate: 96 (02/13 0623) Intake/Output from previous day: 02/12 0701 - 02/13 0700 In: -  Out: 250 [Urine:250] Intake/Output from this shift:    Labs:  Recent Labs  11/03/13 0519 11/05/13 0518  WBC 7.1 7.0  HGB 12.7 12.9  PLT 271 286  CREATININE 0.94 1.01   Estimated Creatinine Clearance: 80.8 ml/min (by C-G formula based on Cr of 1.01).  Recent Labs  11/05/13 0518  [place] 18.3    Microbiology: Recent Results (from the past 720 hour(s))  CULTURE, BLOOD (ROUTINE X 2)     Status: None   Collection Time    10/31/13  4:08 PM      Result Value Ref Range Status   Specimen Description BLOOD LEFT ARM DRAWN BY RN LCT   Final   Special Requests BOTTLES DRAWN AEROBIC ONLY [place]   Final   Culture NO GROWTH 5 DAYS   Final   Report Status 11/05/2013 FINAL   Final  CULTURE, BLOOD (ROUTINE X 2)     Status: None   Collection Time    10/31/13  4:20 PM      Result Value Ref Range Status   Specimen Description BLOOD RIGHT HAND   Final   Special Requests BOTTLES DRAWN AEROBIC AND ANAEROBIC 8CC   Final   Culture NO GROWTH 5 DAYS   Final   Report Status 11/05/2013 FINAL   Final  MRSA PCR SCREENING     Status: None   Collection Time    11/01/13  3:00 AM      Result Value Ref Range Status   MRSA by PCR NEGATIVE  NEGATIVE Final   Comment:            The GeneXpert MRSA Assay (FDA     approved for NASAL specimens     only), is one component of a     comprehensive MRSA colonization     surveillance program. It is not     intended to diagnose MRSA     infection nor to guide or     monitor treatment for     MRSA  infections.  CSF CULTURE     Status: None   Collection Time    11/01/13  2:25 PM      Result Value Ref Range Status   Specimen Description CSF   Final   Special Requests NONE   Final   Gram Stain     Final   Value: CYTOSPIN WBC PRESENT,BOTH PMN AND MONONUCLEAR     NO ORGANISMS SEEN     Performed at [facility]   Culture     Final   Value: NO GROWTH 2 DAYS     Performed at [facility]   Report Status PENDING   Incomplete   Medical History: Past Medical History  Diagnosis Date  . Hypertension    Medications:  Scheduled:  . aspirin  325 mg Oral Daily  . cefTRIAXone (ROCEPHIN)  IV  2 g Intravenous Q12H  . lisinopril  10 mg Oral Daily   And  . hydrochlorothiazide  12.5 mg Oral Daily  . potassium chloride  40 mEq  Oral BID  . sodium chloride  1,000 mL Intravenous Once  . sodium chloride  1,000 mL Intravenous Once  . vancomycin  1,500 mg Intravenous Q12H   Infusions:  . sodium chloride 125 mL/hr at 11/05/13 9563   Assessment: 53yo obese female with HA/CNS sx's for 5 days complicated by acute renal failure on admission.  She has been started on empiric treatment for meningitis.   SCr has improved since admission.  Estimated Creatinine Clearance: 80.8 ml/min (by C-G formula based on Cr of 1.01).  Renal fxn is stable.   Pt is currently afebrile.   WBC is trending down.  Trough level is on target.  Vancomycin 2/8>> Rocephin 2/8>>  Goal of Therapy:  Vancomycin trough 15-20 mcg/ml  Plan:  1.  Continue Vancomycin 1500mg  IV q12h  2.  Check Vancomycin trough at steady state 3.  Continue Rocephin 2gm IV q12h 4.  Monitor renal function, cx data, & LP results  Hart Robinsons A 11/05/2013,7:43 AM

## 2013-11-05 NOTE — Progress Notes (Signed)
Patient ID: Alison Elliott, female   DOB: [birth year], 53 y.o.   MRN: [id number]  [place] A. Merlene Laughter, MD     [email or web address]          Alison Elliott is an 53 y.o. female.   Assessment/Plan: 1. Likely partially treated bacterial meningitis. The patient has improved with the current regimen. Given that the herpes PCR from his bottle is negative, I agree with discontinuation of acyclovir. Continue with the vancomycin and Rocephin.   2. Bilateral Watershed infarct. Likely due to hypovolemia/hypotension/sepsis. Obviously not candidate for TPA due to because of the therapy when there are and the overall medical conditions. On ASA.   3. 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 Etiology is likely due to history of hypertension although considerations are given to possible demyelinating processes. We'll see if a oligoclonal band testing can be run on a leftover spinal fluid available.  She indicated that she continues to improve. She looks a lot more bright and responsive today. No headaches.  GENERAL: This is an obese lady in no acute distress. She is drowsy and sleepy throughout the evaluation.  HEENT: Neck is supple. She does have some dizziness on range of motion but again, the neck seems supple.  ABDOMEN: soft  EXTREMITIES: No edema  BACK: Unremarkable.  SKIN: Normal by inspection.  MENTAL STATUS: Today she is awake and alert. She is oriented to year month and date. She follows commands well and speech is normal.  CRANIAL NERVES: Pupils are equal, round and reactive to light and accommodation; extra ocular movements are full, there is no significant nystagmus; visual fields are full; upper and lower facial muscles are normal in strength and symmetric, there is no flattening of the nasolabial folds; tongue is midline; uvula is midline; shoulder elevation is normal. Attempted funduscopic examination shows that this appears to be flat but very  limited due to lack of cooperation.  MOTOR: Normal tone, bulk and strength; no pronator drift.  COORDINATION: Left finger to nose is normal, right finger to nose is normal, No rest tremor; no intention tremor; no postural tremor; no bradykinesia.    The patient's brain MRI is reviewed in person and shows classic findings of increased signal on FLAIR imaging involving the watershed distribution anteriorly on both sides. However, there is evidence of significant increase leukoencephalopathy involving the posterior horn and parietal areas deep white matter bilaterally. The findings are somewhat concerning for to monitor processes but also could be seen in chronic ischemia.   Objective: Vital signs in last 24 hours: Temp:  [97.5 F (36.4 C)-98.3 F (36.8 C)] 98.3 F (36.8 C) (02/13 0623) Pulse Rate:  [68-106] 96 (02/13 0623) Resp:  [20-26] 20 (02/13 0623) BP: (143-181)/(67-125) 162/102 mmHg (02/13 0808) SpO2:  [96 %-99 %] 96 % (02/13 0623)  Intake/Output from previous day: 02/12 0701 - 02/13 0700 In: -  Out: 250 [Urine:250] Intake/Output this shift:   Nutritional status: Sodium Restricted   Lab Results: Results for orders placed during the hospital encounter of 10/31/13 (from the past 48 hour(s))  CBC     Status: None   Collection Time    11/05/13  5:18 AM      Result Value Ref Range   WBC 7.0  4.0 - 10.5 K/uL   RBC 4.71  3.87 - 5.11 MIL/uL   Hemoglobin 12.9  12.0 - 15.0 g/dL   HCT 39.5  36.0 - 46.0 %   MCV 83.9  78.0 - 100.0 fL  MCH 27.4  26.0 - 34.0 pg   MCHC 32.7  30.0 - 36.0 g/dL   RDW 14.5  11.5 - 15.5 %   Platelets 286  150 - 400 K/uL  BASIC METABOLIC PANEL     Status: Abnormal   Collection Time    11/05/13  5:18 AM      Result Value Ref Range   Sodium 144  137 - 147 mEq/L   Potassium 3.8  3.7 - 5.3 mEq/L   Comment: DELTA CHECK NOTED   Chloride 110  96 - 112 mEq/L   CO2 22  19 - 32 mEq/L   Glucose, Bld 95  70 - 99 mg/dL   BUN 5 (*) 6 - 23 mg/dL   Creatinine,  Ser 1.01  0.50 - 1.10 mg/dL   Calcium 9.1  8.4 - 10.5 mg/dL   GFR calc non Af Amer 63 (*) >90 mL/min   GFR calc Af Amer 73 (*) >90 mL/min   Comment: (NOTE)     The eGFR has been calculated using the CKD EPI equation.     This calculation has not been validated in all clinical situations.     eGFR's persistently <90 mL/min signify possible Chronic Kidney     Disease.  VANCOMYCIN, TROUGH     Status: None   Collection Time    11/05/13  5:18 AM      Result Value Ref Range   Vancomycin Tr 18.3  10.0 - 20.0 ug/mL    Lipid Panel No results found for this basename: CHOL, TRIG, HDL, CHOLHDL, VLDL, LDLCALC,  in the last 72 hours  Studies/Results: US Carotid Duplex Bilateral  11/04/2013   CLINICAL DATA:  Stroke  EXAM: BILATERAL CAROTID DUPLEX ULTRASOUND  TECHNIQUE: Pearline Cables scale imaging, color Doppler and duplex ultrasound were performed of bilateral carotid and vertebral arteries in the neck.  COMPARISON:  Recent brain MRI 11/01/2013  FINDINGS: Technically challenging examination secondary to patient body habitus and irregular breathing/snoring.  Criteria: Quantification of carotid stenosis is based on velocity parameters that correlate the residual internal carotid diameter with NASCET-based stenosis levels, using the diameter of the distal internal carotid lumen as the denominator for stenosis measurement.  The following velocity measurements were obtained:  RIGHT  ICA:  62/25 cm/sec  CCA:  41/32 cm/sec  SYSTOLIC ICA/CCA RATIO:  4.40  DIASTOLIC ICA/CCA RATIO:  1.02  ECA:  76 cm/sec  LEFT  ICA:  93/36 cm/sec  CCA:  72/53 cm/sec  SYSTOLIC ICA/CCA RATIO:  1.1  DIASTOLIC ICA/CCA RATIO:  1.6  ECA:  64 cm/sec  RIGHT CAROTID ARTERY: No significant atherosclerotic plaque or evidence of stenosis.  RIGHT VERTEBRAL ARTERY:  Patent with normal antegrade flow.  LEFT CAROTID ARTERY: No significant atherosclerotic plaque or evidence of stenosis.  LEFT VERTEBRAL ARTERY:  Patent with normal antegrade flow.  IMPRESSION:  Unremarkable carotid ultrasound.   Electronically Signed   By: Jacqulynn Cadet M.D.   On: 11/04/2013 14:58    ECHO   - Left ventricle: The cavity size was normal. Wall thickness was increased in a pattern of mild LVH. Systolic function was mildly reduced. The estimated ejection fraction was in the range of 45% to 50%. Diffuse hypokinesis. Left ventricular diastolic function parameters were normal. - Aortic valve: Mildly calcified annulus. Trileaflet. No significant regurgitation. - Mitral valve: Mild regurgitation. - Left atrium: The atrium was moderately dilated. - Right atrium: The atrium was mildly dilated. - Atrial septum: Poorly visualized. - Pulmonary arteries: PA peak pressure: 54m Hg (  S). - Pericardium, extracardiac: There was no pericardial effusion. Impressions:  - Mild LVH with LVEF 38-10%, grade 1 diastolic dysfunction. Moderate left atrial enlargement and mild right atrial enlargement. Mildly thickened mitral leaflets with mild mitral regurgitation. Atrial septum is not well seen. Trivial tricuspid regurgitation with normal PASP 21 mm mercury.    Medications:  Scheduled Meds: . aspirin  325 mg Oral Daily  . cefTRIAXone (ROCEPHIN)  IV  2 g Intravenous Q12H  . lisinopril  10 mg Oral Daily   And  . hydrochlorothiazide  12.5 mg Oral Daily  . potassium chloride  40 mEq Oral BID  . sodium chloride  1,000 mL Intravenous Once  . sodium chloride  1,000 mL Intravenous Once  . vancomycin  1,500 mg Intravenous Q12H   Continuous Infusions: . sodium chloride 125 mL/hr at 11/05/13 0614   PRN Meds:.acetaminophen     LOS: 5 days   Levester Waldridge A. Merlene Laughter, M.D.  Diplomate, Tax adviser of Psychiatry and Neurology ( Neurology).

## 2013-11-06 LAB — CSF CULTURE: CULTURE: NO GROWTH

## 2013-11-06 LAB — CSF CULTURE W GRAM STAIN

## 2013-11-06 MED ORDER — LISINOPRIL 10 MG PO TABS
20.0000 mg | ORAL_TABLET | Freq: Every day | ORAL | Status: DC
  Administered 2013-11-06 – 2013-11-09 (×4): 20 mg via ORAL
  Filled 2013-11-06 (×4): qty 2

## 2013-11-06 MED ORDER — HYDROCHLOROTHIAZIDE 12.5 MG PO CAPS
12.5000 mg | ORAL_CAPSULE | Freq: Every day | ORAL | Status: DC
  Administered 2013-11-06 – 2013-11-09 (×4): 12.5 mg via ORAL
  Filled 2013-11-06 (×4): qty 1

## 2013-11-06 NOTE — Progress Notes (Signed)
Subjective: She says she feels better. She does not have much headache. She has decided she wants to complete her course of antibiotics in the hospital this is day #7  Objective: Vital signs in last 24 hours: Temp:  [98 F (36.7 C)-98.2 F (36.8 C)] 98 F (36.7 C) (02/13 2016) Pulse Rate:  [90-100] 100 (02/13 2016) Resp:  [20] 20 (02/13 2016) BP: (150-164)/(86-104) 164/104 mmHg (02/13 2016) SpO2:  [99 %-100 %] 99 % (02/13 2016) Weight change:  Last BM Date: 11/05/13  Intake/Output from previous day: 02/13 0701 - 02/14 0700 In: 720 [P.O.:720] Out: 400 [Emesis/NG output:400]  PHYSICAL EXAM General appearance: alert, cooperative and no distress Resp: clear to auscultation bilaterally Cardio: regular rate and rhythm, S1, S2 normal, no murmur, click, rub or gallop GI: soft, non-tender; bowel sounds normal; no masses,  no organomegaly Extremities: extremities normal, atraumatic, no cyanosis or edema  Lab Results:    Basic Metabolic Panel:  Recent Labs  11/05/13 0518  NA 144  K 3.8  CL 110  CO2 22  GLUCOSE 95  BUN 5*  CREATININE 1.01  CALCIUM 9.1   Liver Function Tests: No results found for this basename: AST, ALT, ALKPHOS, BILITOT, PROT, ALBUMIN,  in the last 72 hours No results found for this basename: LIPASE, AMYLASE,  in the last 72 hours No results found for this basename: AMMONIA,  in the last 72 hours CBC:  Recent Labs  11/05/13 0518  WBC 7.0  HGB 12.9  HCT 39.5  MCV 83.9  PLT 286   Cardiac Enzymes: No results found for this basename: CKTOTAL, CKMB, CKMBINDEX, TROPONINI,  in the last 72 hours BNP: No results found for this basename: PROBNP,  in the last 72 hours D-Dimer: No results found for this basename: DDIMER,  in the last 72 hours CBG: No results found for this basename: GLUCAP,  in the last 72 hours Hemoglobin A1C: No results found for this basename: HGBA1C,  in the last 72 hours Fasting Lipid Panel: No results found for this basename:  CHOL, HDL, LDLCALC, TRIG, CHOLHDL, LDLDIRECT,  in the last 72 hours Thyroid Function Tests: No results found for this basename: TSH, T4TOTAL, FREET4, T3FREE, THYROIDAB,  in the last 72 hours Anemia Panel: No results found for this basename: VITAMINB12, FOLATE, FERRITIN, TIBC, IRON, RETICCTPCT,  in the last 72 hours Coagulation: No results found for this basename: LABPROT, INR,  in the last 72 hours Urine Drug Screen: Drugs of Abuse     Component Value Date/Time   LABOPIA POSITIVE* 10/31/2013 2023   COCAINSCRNUR NONE DETECTED 10/31/2013 2023   LABBENZ NONE DETECTED 10/31/2013 2023   AMPHETMU NONE DETECTED 10/31/2013 2023   THCU NONE DETECTED 10/31/2013 2023   LABBARB NONE DETECTED 10/31/2013 2023    Alcohol Level: No results found for this basename: ETH,  in the last 72 hours Urinalysis: No results found for this basename: COLORURINE, APPERANCEUR, LABSPEC, PHURINE, GLUCOSEU, HGBUR, BILIRUBINUR, KETONESUR, PROTEINUR, UROBILINOGEN, NITRITE, LEUKOCYTESUR,  in the last 72 hours Misc. Labs:  ABGS No results found for this basename: PHART, PCO2, PO2ART, TCO2, HCO3,  in the last 72 hours CULTURES Recent Results (from the past 240 hour(s))  CULTURE, BLOOD (ROUTINE X 2)     Status: None   Collection Time    10/31/13  4:08 PM      Result Value Ref Range Status   Specimen Description BLOOD LEFT ARM DRAWN BY RN LCT   Final   Special Requests BOTTLES DRAWN AEROBIC ONLY [place]  Final   Culture NO GROWTH 5 DAYS   Final   Report Status 11/05/2013 FINAL   Final  CULTURE, BLOOD (ROUTINE X 2)     Status: None   Collection Time    10/31/13  4:20 PM      Result Value Ref Range Status   Specimen Description BLOOD RIGHT HAND   Final   Special Requests BOTTLES DRAWN AEROBIC AND ANAEROBIC 8CC   Final   Culture NO GROWTH 5 DAYS   Final   Report Status 11/05/2013 FINAL   Final  MRSA PCR SCREENING     Status: None   Collection Time    11/01/13  3:00 AM      Result Value Ref Range Status   MRSA by PCR NEGATIVE   NEGATIVE Final   Comment:            The GeneXpert MRSA Assay (FDA     approved for NASAL specimens     only), is one component of a     comprehensive MRSA colonization     surveillance program. It is not     intended to diagnose MRSA     infection nor to guide or     monitor treatment for     MRSA infections.  CSF CULTURE     Status: None   Collection Time    11/01/13  2:25 PM      Result Value Ref Range Status   Specimen Description CSF   Final   Special Requests NONE   Final   Gram Stain     Final   Value: CYTOSPIN WBC PRESENT,BOTH PMN AND MONONUCLEAR     NO ORGANISMS SEEN     Performed at [facility]   Culture     Final   Value: NO GROWTH 3 DAYS     Performed at [facility]   Report Status PENDING   Incomplete   Studies/Results: US Carotid Duplex Bilateral  11/04/2013   CLINICAL DATA:  Stroke  EXAM: BILATERAL CAROTID DUPLEX ULTRASOUND  TECHNIQUE: Pearline Cables scale imaging, color Doppler and duplex ultrasound were performed of bilateral carotid and vertebral arteries in the neck.  COMPARISON:  Recent brain MRI 11/01/2013  FINDINGS: Technically challenging examination secondary to patient body habitus and irregular breathing/snoring.  Criteria: Quantification of carotid stenosis is based on velocity parameters that correlate the residual internal carotid diameter with NASCET-based stenosis levels, using the diameter of the distal internal carotid lumen as the denominator for stenosis measurement.  The following velocity measurements were obtained:  RIGHT  ICA:  62/25 cm/sec  CCA:  99991111 cm/sec  SYSTOLIC ICA/CCA RATIO:  123456  DIASTOLIC ICA/CCA RATIO:  0000000  ECA:  76 cm/sec  LEFT  ICA:  93/36 cm/sec  CCA:  99991111 cm/sec  SYSTOLIC ICA/CCA RATIO:  1.1  DIASTOLIC ICA/CCA RATIO:  1.6  ECA:  64 cm/sec  RIGHT CAROTID ARTERY: No significant atherosclerotic plaque or evidence of stenosis.  RIGHT VERTEBRAL ARTERY:  Patent with normal antegrade flow.  LEFT CAROTID ARTERY: No significant  atherosclerotic plaque or evidence of stenosis.  LEFT VERTEBRAL ARTERY:  Patent with normal antegrade flow.  IMPRESSION: Unremarkable carotid ultrasound.   Electronically Signed   By: Jacqulynn Cadet M.D.   On: 11/04/2013 14:58    Medications:  Prior to Admission:  Prescriptions prior to admission  Medication Sig Dispense Refill  . DM-APAP-CPM (CORICIDIN HBP FLU) 15-500-2 MG TABS Take 2 tablets by mouth every 6 (six) hours as needed. Cold/flu      .  fluticasone (FLONASE) 50 MCG/ACT nasal spray Place 1 spray into both nostrils 2 (two) times daily.      Marland Kitchen HYDROcodone-acetaminophen (NORCO/VICODIN) 5-325 MG per tablet Take 1-2 tablets by mouth every 6 (six) hours as needed for moderate pain.  14 tablet  0  . lisinopril-hydrochlorothiazide (PRINZIDE,ZESTORETIC) 10-12.5 MG per tablet Take 1 tablet by mouth daily.      . ondansetron (ZOFRAN ODT) 4 MG disintegrating tablet Take 1 tablet (4 mg total) by mouth every 8 (eight) hours as needed.  12 tablet  1   Scheduled: . aspirin  325 mg Oral Daily  . cefTRIAXone (ROCEPHIN)  IV  2 g Intravenous Q12H  . lisinopril  10 mg Oral Daily   And  . hydrochlorothiazide  12.5 mg Oral Daily  . potassium chloride  40 mEq Oral BID  . sodium chloride  1,000 mL Intravenous Once  . sodium chloride  1,000 mL Intravenous Once  . vancomycin  1,500 mg Intravenous Q12H   Continuous: . sodium chloride 125 mL/hr at 11/05/13 1741   GUR:[id number]  Assesment: She was admitted with febrile illness and is thought to have meningitis. This is being treated with antibiotics and she is on day 7. She had headache and altered mental state from that and she's much improved. She was hypotensive but she now has hypertension which will need to be treated Principal Problem:   Fever Active Problems:   Acute renal failure   Headache   Hypotension   Altered mental state    Plan: Continue current treatments. I increased lisinopril to 20 mg. She will continue current dose  of hydrochlorothiazide    LOS: 6 days   Ayen Viviano L 11/06/2013, 9:52 AM

## 2013-11-07 NOTE — Progress Notes (Signed)
Subjective: She feels okay. She has a little bit of headache. She has no other new complaints. She is not having any pain in her neck.  Objective: Vital signs in last 24 hours: Temp:  [98.5 F (36.9 C)-98.7 F (37.1 C)] 98.5 F (36.9 C) (02/15 0509) Pulse Rate:  [86-87] 87 (02/15 0509) Resp:  [20] 20 (02/15 0509) BP: (145-150)/(71-82) 145/71 mmHg (02/15 0509) SpO2:  [100 %] 100 % (02/15 0509) Weight change:  Last BM Date: 11/06/13  Intake/Output from previous day: 02/14 0701 - 02/15 0700 In: 11461.7 [P.O.:720; I.V.:7941.7; IV Piggyback:2800] Out: -   PHYSICAL EXAM General appearance: alert, cooperative and no distress Resp: clear to auscultation bilaterally Cardio: regular rate and rhythm, S1, S2 normal, no murmur, click, rub or gallop GI: soft, non-tender; bowel sounds normal; no masses,  no organomegaly Extremities: extremities normal, atraumatic, no cyanosis or edema  Lab Results:    Basic Metabolic Panel:  Recent Labs  11/05/13 0518  NA 144  K 3.8  CL 110  CO2 22  GLUCOSE 95  BUN 5*  CREATININE 1.01  CALCIUM 9.1   Liver Function Tests: No results found for this basename: AST, ALT, ALKPHOS, BILITOT, PROT, ALBUMIN,  in the last 72 hours No results found for this basename: LIPASE, AMYLASE,  in the last 72 hours No results found for this basename: AMMONIA,  in the last 72 hours CBC:  Recent Labs  11/05/13 0518  WBC 7.0  HGB 12.9  HCT 39.5  MCV 83.9  PLT 286   Cardiac Enzymes: No results found for this basename: CKTOTAL, CKMB, CKMBINDEX, TROPONINI,  in the last 72 hours BNP: No results found for this basename: PROBNP,  in the last 72 hours D-Dimer: No results found for this basename: DDIMER,  in the last 72 hours CBG: No results found for this basename: GLUCAP,  in the last 72 hours Hemoglobin A1C: No results found for this basename: HGBA1C,  in the last 72 hours Fasting Lipid Panel: No results found for this basename: CHOL, HDL, LDLCALC, TRIG,  CHOLHDL, LDLDIRECT,  in the last 72 hours Thyroid Function Tests: No results found for this basename: TSH, T4TOTAL, FREET4, T3FREE, THYROIDAB,  in the last 72 hours Anemia Panel: No results found for this basename: VITAMINB12, FOLATE, FERRITIN, TIBC, IRON, RETICCTPCT,  in the last 72 hours Coagulation: No results found for this basename: LABPROT, INR,  in the last 72 hours Urine Drug Screen: Drugs of Abuse     Component Value Date/Time   LABOPIA POSITIVE* 10/31/2013 2023   COCAINSCRNUR NONE DETECTED 10/31/2013 2023   LABBENZ NONE DETECTED 10/31/2013 2023   AMPHETMU NONE DETECTED 10/31/2013 2023   THCU NONE DETECTED 10/31/2013 2023   LABBARB NONE DETECTED 10/31/2013 2023    Alcohol Level: No results found for this basename: ETH,  in the last 72 hours Urinalysis: No results found for this basename: COLORURINE, APPERANCEUR, LABSPEC, PHURINE, GLUCOSEU, HGBUR, BILIRUBINUR, KETONESUR, PROTEINUR, UROBILINOGEN, NITRITE, LEUKOCYTESUR,  in the last 72 hours Misc. Labs:  ABGS No results found for this basename: PHART, PCO2, PO2ART, TCO2, HCO3,  in the last 72 hours CULTURES Recent Results (from the past 240 hour(s))  CULTURE, BLOOD (ROUTINE X 2)     Status: None   Collection Time    10/31/13  4:08 PM      Result Value Ref Range Status   Specimen Description BLOOD LEFT ARM DRAWN BY RN LCT   Final   Special Requests BOTTLES DRAWN AEROBIC ONLY [place]   Final  Culture NO GROWTH 5 DAYS   Final   Report Status 11/05/2013 FINAL   Final  CULTURE, BLOOD (ROUTINE X 2)     Status: None   Collection Time    10/31/13  4:20 PM      Result Value Ref Range Status   Specimen Description BLOOD RIGHT HAND   Final   Special Requests BOTTLES DRAWN AEROBIC AND ANAEROBIC 8CC   Final   Culture NO GROWTH 5 DAYS   Final   Report Status 11/05/2013 FINAL   Final  MRSA PCR SCREENING     Status: None   Collection Time    11/01/13  3:00 AM      Result Value Ref Range Status   MRSA by PCR NEGATIVE  NEGATIVE Final   Comment:             The GeneXpert MRSA Assay (FDA     approved for NASAL specimens     only), is one component of a     comprehensive MRSA colonization     surveillance program. It is not     intended to diagnose MRSA     infection nor to guide or     monitor treatment for     MRSA infections.  CSF CULTURE     Status: None   Collection Time    11/01/13  2:25 PM      Result Value Ref Range Status   Specimen Description CSF   Final   Special Requests NONE   Final   Gram Stain     Final   Value: CYTOSPIN WBC PRESENT,BOTH PMN AND MONONUCLEAR     NO ORGANISMS SEEN     Performed at [facility]   Culture     Final   Value: NO GROWTH 3 DAYS     Performed at [facility]   Report Status 11/06/2013 FINAL   Final   Studies/Results: No results found.  Medications:  Prior to Admission:  Prescriptions prior to admission  Medication Sig Dispense Refill  . DM-APAP-CPM (CORICIDIN HBP FLU) 15-500-2 MG TABS Take 2 tablets by mouth every 6 (six) hours as needed. Cold/flu      . fluticasone (FLONASE) 50 MCG/ACT nasal spray Place 1 spray into both nostrils 2 (two) times daily.      Marland Kitchen HYDROcodone-acetaminophen (NORCO/VICODIN) 5-325 MG per tablet Take 1-2 tablets by mouth every 6 (six) hours as needed for moderate pain.  14 tablet  0  . lisinopril-hydrochlorothiazide (PRINZIDE,ZESTORETIC) 10-12.5 MG per tablet Take 1 tablet by mouth daily.      . ondansetron (ZOFRAN ODT) 4 MG disintegrating tablet Take 1 tablet (4 mg total) by mouth every 8 (eight) hours as needed.  12 tablet  1   Scheduled: . aspirin  325 mg Oral Daily  . cefTRIAXone (ROCEPHIN)  IV  2 g Intravenous Q12H  . lisinopril  20 mg Oral Daily   And  . hydrochlorothiazide  12.5 mg Oral Daily  . potassium chloride  40 mEq Oral BID  . sodium chloride  1,000 mL Intravenous Once  . sodium chloride  1,000 mL Intravenous Once  . vancomycin  1,500 mg Intravenous Q12H   Continuous:  WNI:[id number]  Assesment: She was admitted  with febrile illness and is felt to have meningitis. She is now on day 8 of her antibiotics and she needs 10 days of IV antibiotics. She has no new complaints. Principal Problem:   Fever Active Problems:   Acute renal failure  Headache   Hypotension   Altered mental state    Plan: Continue with IV antibiotics    LOS: 7 days   Alison Elliott L 11/07/2013, 11:12 AM

## 2013-11-08 DIAGNOSIS — I10 Essential (primary) hypertension: Secondary | ICD-10-CM | POA: Diagnosis present

## 2013-11-08 LAB — BASIC METABOLIC PANEL
BUN: 7 mg/dL (ref 6–23)
CO2: 22 mEq/L (ref 19–32)
Calcium: 9.3 mg/dL (ref 8.4–10.5)
Chloride: 114 mEq/L — ABNORMAL HIGH (ref 96–112)
Creatinine, Ser: 1.16 mg/dL — ABNORMAL HIGH (ref 0.50–1.10)
GFR calc Af Amer: 62 mL/min — ABNORMAL LOW (ref >90)
GFR, EST NON AFRICAN AMERICAN: 53 mL/min — AB (ref >90)
Glucose, Bld: 89 mg/dL (ref 70–99)
Potassium: 3.9 mEq/L (ref 3.7–5.3)
Sodium: 149 mEq/L — ABNORMAL HIGH (ref 137–147)

## 2013-11-08 LAB — CBC
HEMATOCRIT: 37.8 % (ref 36.0–46.0)
Hemoglobin: 12.3 g/dL (ref 12.0–15.0)
MCH: 27.3 pg (ref 26.0–34.0)
MCHC: 32.5 g/dL (ref 30.0–36.0)
MCV: 83.8 fL (ref 78.0–100.0)
PLATELETS: 361 10*3/uL (ref 150–400)
RBC: 4.51 MIL/uL (ref 3.87–5.11)
RDW: 14.7 % (ref 11.5–15.5)
WBC: 5.6 10*3/uL (ref 4.0–10.5)

## 2013-11-08 MED ORDER — DEXTROSE 5 % IV SOLN
INTRAVENOUS | Status: AC
  Filled 2013-11-08: qty 10

## 2013-11-08 NOTE — Progress Notes (Signed)
ANTIBIOTIC CONSULT NOTE  Pharmacy Consult for vancomycin (with Rocephin) Indication: CNS infection - R/o bacterial meningitis  No Known Allergies  Patient Measurements: Height: 5\' 3"  (160 cm) Weight: 259 lb 11.2 oz (117.8 kg) IBW/kg (Calculated) : 52.4  Vital Signs: Temp: 97.6 F (36.4 C) (02/16 0442) Temp src: Oral (02/16 0442) BP: 140/78 mmHg (02/16 0442) Pulse Rate: 78 (02/16 0442) Intake/Output from previous day: 02/15 0701 - 02/16 0700 In: 720 [P.O.:720] Out: -  Intake/Output from this shift:    Labs:  Recent Labs  11/08/13 0444  WBC 5.6  HGB 12.3  PLT 361  CREATININE 1.16*   Estimated Creatinine Clearance: 70.4 ml/min (by C-G formula based on Cr of 1.16). No results found for this basename: VANCOTROUGH, Corlis Leak, VANCORANDOM, [place], GENTPEAK, GENTRANDOM, TOBRATROUGH, TOBRAPEAK, TOBRARND, AMIKACINPEAK, AMIKACINTROU, AMIKACIN,  in the last 72 hours  Microbiology: Recent Results (from the past 720 hour(s))  CULTURE, BLOOD (ROUTINE X 2)     Status: None   Collection Time    10/31/13  4:08 PM      Result Value Ref Range Status   Specimen Description BLOOD LEFT ARM DRAWN BY RN LCT   Final   Special Requests BOTTLES DRAWN AEROBIC ONLY [place]   Final   Culture NO GROWTH 5 DAYS   Final   Report Status 11/05/2013 FINAL   Final  CULTURE, BLOOD (ROUTINE X 2)     Status: None   Collection Time    10/31/13  4:20 PM      Result Value Ref Range Status   Specimen Description BLOOD RIGHT HAND   Final   Special Requests BOTTLES DRAWN AEROBIC AND ANAEROBIC [place]   Final   Culture NO GROWTH 5 DAYS   Final   Report Status 11/05/2013 FINAL   Final  MRSA PCR SCREENING     Status: None   Collection Time    11/01/13  3:00 AM      Result Value Ref Range Status   MRSA by PCR NEGATIVE  NEGATIVE Final   Comment:            The GeneXpert MRSA Assay (FDA     approved for NASAL specimens     only), is one component of a     comprehensive MRSA colonization     surveillance  program. It is not     intended to diagnose MRSA     infection nor to guide or     monitor treatment for     MRSA infections.  CSF CULTURE     Status: None   Collection Time    11/01/13  2:25 PM      Result Value Ref Range Status   Specimen Description CSF   Final   Special Requests NONE   Final   Gram Stain     Final   Value: CYTOSPIN WBC PRESENT,BOTH PMN AND MONONUCLEAR     NO ORGANISMS SEEN     Performed at [facility]   Culture     Final   Value: NO GROWTH 3 DAYS     Performed at [facility]   Report Status 11/06/2013 FINAL   Final   Medical History: Past Medical History  Diagnosis Date  . Hypertension    Medications:  Scheduled:  . aspirin  325 mg Oral Daily  . cefTRIAXone (ROCEPHIN)  IV  2 g Intravenous Q12H  . lisinopril  20 mg Oral Daily   And  . hydrochlorothiazide  12.5 mg Oral  Daily  . potassium chloride  40 mEq Oral BID  . sodium chloride  1,000 mL Intravenous Once  . sodium chloride  1,000 mL Intravenous Once  . vancomycin  1,500 mg Intravenous Q12H   Infusions:    Assessment: 53yo obese female with HA/CNS sx's for 5 days complicated by acute renal failure on admission.  She has been started on empiric treatment for meningitis.   SCr has improved since admission.  Estimated Creatinine Clearance: 70.4 ml/min (by C-G formula based on Cr of 1.16).  Renal fxn is relatively stable but SCR bumped up slightly today.  Pt is currently afebrile.   WBC is normal.  Trough level is on target when last checked.  Vancomycin 2/8>> Rocephin 2/8>>  Goal of Therapy:  Vancomycin trough 15-20 mcg/ml  Plan:  1.  Continue Vancomycin 1500mg  IV q12h  2.  Check Vancomycin trough if Rx continues beyond 11/11/13. 3.  Continue Rocephin 2gm IV q12h 4.  Monitor renal function, cx data, & LP results  Hart Robinsons A 11/08/2013,8:42 AM

## 2013-11-08 NOTE — Progress Notes (Signed)
Subjective: She says she feels okay. She still has some headache but it is much less. She has not been confused. She has no other new complaints  Objective: Vital signs in last 24 hours: Temp:  [97.6 F (36.4 C)-98.6 F (37 C)] 97.6 F (36.4 C) (02/16 0442) Pulse Rate:  [76-86] 78 (02/16 0442) Resp:  [20] 20 (02/16 0442) BP: (115-147)/(71-78) 140/78 mmHg (02/16 0442) SpO2:  [98 %-100 %] 100 % (02/16 0442) Weight change:  Last BM Date: 11/06/13  Intake/Output from previous day: 02/15 0701 - 02/16 0700 In: 720 [P.O.:720] Out: -   PHYSICAL EXAM General appearance: alert, cooperative and no distress Resp: clear to auscultation bilaterally Cardio: regular rate and rhythm, S1, S2 normal, no murmur, click, rub or gallop GI: soft, non-tender; bowel sounds normal; no masses,  no organomegaly Extremities: extremities normal, atraumatic, no cyanosis or edema  Lab Results:    Basic Metabolic Panel:  Recent Labs  11/08/13 0444  NA 149*  K 3.9  CL 114*  CO2 22  GLUCOSE 89  BUN 7  CREATININE 1.16*  CALCIUM 9.3   Liver Function Tests: No results found for this basename: AST, ALT, ALKPHOS, BILITOT, PROT, ALBUMIN,  in the last 72 hours No results found for this basename: LIPASE, AMYLASE,  in the last 72 hours No results found for this basename: AMMONIA,  in the last 72 hours CBC:  Recent Labs  11/08/13 0444  WBC 5.6  HGB 12.3  HCT 37.8  MCV 83.8  PLT 361   Cardiac Enzymes: No results found for this basename: CKTOTAL, CKMB, CKMBINDEX, TROPONINI,  in the last 72 hours BNP: No results found for this basename: PROBNP,  in the last 72 hours D-Dimer: No results found for this basename: DDIMER,  in the last 72 hours CBG: No results found for this basename: GLUCAP,  in the last 72 hours Hemoglobin A1C: No results found for this basename: HGBA1C,  in the last 72 hours Fasting Lipid Panel: No results found for this basename: CHOL, HDL, LDLCALC, TRIG, CHOLHDL, LDLDIRECT,   in the last 72 hours Thyroid Function Tests: No results found for this basename: TSH, T4TOTAL, FREET4, T3FREE, THYROIDAB,  in the last 72 hours Anemia Panel: No results found for this basename: VITAMINB12, FOLATE, FERRITIN, TIBC, IRON, RETICCTPCT,  in the last 72 hours Coagulation: No results found for this basename: LABPROT, INR,  in the last 72 hours Urine Drug Screen: Drugs of Abuse     Component Value Date/Time   LABOPIA POSITIVE* 10/31/2013 2023   COCAINSCRNUR NONE DETECTED 10/31/2013 2023   LABBENZ NONE DETECTED 10/31/2013 2023   AMPHETMU NONE DETECTED 10/31/2013 2023   THCU NONE DETECTED 10/31/2013 2023   LABBARB NONE DETECTED 10/31/2013 2023    Alcohol Level: No results found for this basename: ETH,  in the last 72 hours Urinalysis: No results found for this basename: COLORURINE, APPERANCEUR, LABSPEC, PHURINE, GLUCOSEU, HGBUR, BILIRUBINUR, KETONESUR, PROTEINUR, UROBILINOGEN, NITRITE, LEUKOCYTESUR,  in the last 72 hours Misc. Labs:  ABGS No results found for this basename: PHART, PCO2, PO2ART, TCO2, HCO3,  in the last 72 hours CULTURES Recent Results (from the past 240 hour(s))  CULTURE, BLOOD (ROUTINE X 2)     Status: None   Collection Time    10/31/13  4:08 PM      Result Value Ref Range Status   Specimen Description BLOOD LEFT ARM DRAWN BY RN LCT   Final   Special Requests BOTTLES DRAWN AEROBIC ONLY [place]   Final  Culture NO GROWTH 5 DAYS   Final   Report Status 11/05/2013 FINAL   Final  CULTURE, BLOOD (ROUTINE X 2)     Status: None   Collection Time    10/31/13  4:20 PM      Result Value Ref Range Status   Specimen Description BLOOD RIGHT HAND   Final   Special Requests BOTTLES DRAWN AEROBIC AND ANAEROBIC 8CC   Final   Culture NO GROWTH 5 DAYS   Final   Report Status 11/05/2013 FINAL   Final  MRSA PCR SCREENING     Status: None   Collection Time    11/01/13  3:00 AM      Result Value Ref Range Status   MRSA by PCR NEGATIVE  NEGATIVE Final   Comment:            The  GeneXpert MRSA Assay (FDA     approved for NASAL specimens     only), is one component of a     comprehensive MRSA colonization     surveillance program. It is not     intended to diagnose MRSA     infection nor to guide or     monitor treatment for     MRSA infections.  CSF CULTURE     Status: None   Collection Time    11/01/13  2:25 PM      Result Value Ref Range Status   Specimen Description CSF   Final   Special Requests NONE   Final   Gram Stain     Final   Value: CYTOSPIN WBC PRESENT,BOTH PMN AND MONONUCLEAR     NO ORGANISMS SEEN     Performed at [facility]   Culture     Final   Value: NO GROWTH 3 DAYS     Performed at [facility]   Report Status 11/06/2013 FINAL   Final   Studies/Results: No results found.  Medications:  Prior to Admission:  Prescriptions prior to admission  Medication Sig Dispense Refill  . DM-APAP-CPM (CORICIDIN HBP FLU) 15-500-2 MG TABS Take 2 tablets by mouth every 6 (six) hours as needed. Cold/flu      . fluticasone (FLONASE) 50 MCG/ACT nasal spray Place 1 spray into both nostrils 2 (two) times daily.      Marland Kitchen HYDROcodone-acetaminophen (NORCO/VICODIN) 5-325 MG per tablet Take 1-2 tablets by mouth every 6 (six) hours as needed for moderate pain.  14 tablet  0  . lisinopril-hydrochlorothiazide (PRINZIDE,ZESTORETIC) 10-12.5 MG per tablet Take 1 tablet by mouth daily.      . ondansetron (ZOFRAN ODT) 4 MG disintegrating tablet Take 1 tablet (4 mg total) by mouth every 8 (eight) hours as needed.  12 tablet  1   Scheduled: . aspirin  325 mg Oral Daily  . cefTRIAXone (ROCEPHIN)  IV  2 g Intravenous Q12H  . lisinopril  20 mg Oral Daily   And  . hydrochlorothiazide  12.5 mg Oral Daily  . potassium chloride  40 mEq Oral BID  . sodium chloride  1,000 mL Intravenous Once  . sodium chloride  1,000 mL Intravenous Once  . vancomycin  1,500 mg Intravenous Q12H   Continuous:  [id number]  Assesment: She was admitted with febrile  illness headache and abnormal cerebrospinal fluid and is being treated for meningitis. She will require 10 days of IV antibiotics.  She had acute renal failure which is better.  She had hypotension on admission but now has been hypertensive. That's  better with adjustment of her medications Principal Problem:   Fever Active Problems:   Acute renal failure   Headache   Hypotension   Altered mental state    Plan: Today is day 9 of IV antibiotics stools we can get her IV antibiotics done tomorrow morning she can go home after that    LOS: 8 days   Ausha Sieh L 11/08/2013, 8:40 AM

## 2013-11-08 NOTE — Progress Notes (Signed)
Nutrition Brief Follow-up Note  Patient last assessed by RD on 10/29/13 due to Malnutrition Screen Score. Chart reviewed again due to LOS (day 7).  Wt Readings from Last 15 Encounters:  11/04/13 259 lb 11.2 oz (117.8 kg)  10/29/13 248 lb (112.492 kg)  03/02/13 252 lb (114.306 kg)   Pt continues on IV antiobotics, which she will complete as an inpatient.   Noted 11# (4.4%) wt gain x 7 days, likely due to fluid rentention and IV fluids and medications. Pt also on diuretics.   Body mass index is 46.02 kg/(m^2). Patient meets criteria for extreme obesity, class III based on current BMI.   Current diet order is Heart Healthy, patient is consuming approximately 100% of meals at this time. Labs and medications reviewed.   No nutrition interventions warranted at this time. If nutrition issues arise, please consult RD.   Novie Maggio A. Jimmye Norman, RD, LDN Pager: [phone]

## 2013-11-09 DIAGNOSIS — G009 Bacterial meningitis, unspecified: Secondary | ICD-10-CM | POA: Diagnosis present

## 2013-11-09 HISTORY — DX: Bacterial meningitis, unspecified: G00.9

## 2013-11-09 MED ORDER — LISINOPRIL-HYDROCHLOROTHIAZIDE 20-12.5 MG PO TABS: 1.0000 | ORAL_TABLET | Freq: Every day | ORAL | Status: DC

## 2013-11-09 NOTE — Discharge Summary (Signed)
Physician Discharge Summary  Patient ID: Alison Elliott MRN: [id number] DOB/AGE: 1960-11-06 53 y.o. Primary Care Physician:FANTA,TESFAYE, MD Admit date: 10/31/2013 Discharge date: 11/09/2013    Discharge Diagnoses:   Principal Problem:   Fever Active Problems:   Acute renal failure   Headache   Hypotension   Altered mental state   Essential hypertension, benign   Bacterial meningitis     Medication List    STOP taking these medications       lisinopril-hydrochlorothiazide 10-12.5 MG per tablet  Commonly known as:  PRINZIDE,ZESTORETIC  Replaced by:  lisinopril-hydrochlorothiazide 20-12.5 MG per tablet      TAKE these medications       CORICIDIN HBP FLU 15-500-2 MG Tabs  Generic drug:  DM-APAP-CPM  Take 2 tablets by mouth every 6 (six) hours as needed. Cold/flu     fluticasone 50 MCG/ACT nasal spray  Commonly known as:  FLONASE  Place 1 spray into both nostrils 2 (two) times daily.     HYDROcodone-acetaminophen 5-325 MG per tablet  Commonly known as:  NORCO/VICODIN  Take 1-2 tablets by mouth every 6 (six) hours as needed for moderate pain.     lisinopril-hydrochlorothiazide 20-12.5 MG per tablet  Commonly known as:  ZESTORETIC  Take 1 tablet by mouth daily.     ondansetron 4 MG disintegrating tablet  Commonly known as:  ZOFRAN ODT  Take 1 tablet (4 mg total) by mouth every 8 (eight) hours as needed.        Discharged Condition: Improved    Consults: Infectious disease by telephone/neurology  Significant Diagnostic Studies: Ct Head Wo Contrast  10/31/2013   CLINICAL DATA:  Headaches since Monday.  History of hypertension.  EXAM: CT HEAD WITHOUT CONTRAST  TECHNIQUE: Contiguous axial images were obtained from the base of the skull through the vertex without intravenous contrast.  COMPARISON:  CT HEAD W/O CM dated 10/29/2013; CT MAXILLOFACIAL W/O CM SINUS dated 10/29/2013  FINDINGS: No mass lesion, mass effect, midline shift, hydrocephalus, hemorrhage. No territorial  ischemia or acute infarction. Periventricular white matter disease is present, likely chronic ischemic although nonspecific. This appears unchanged compared to 10/29/2013. Unchanged mild paranasal sinus disease with mucosal thickening. Calvarium intact.  IMPRESSION: No acute intracranial abnormality. Unchanged periventricular white matter disease, nonspecific but likely chronic ischemic.   Electronically Signed   By: Dereck Ligas M.D.   On: 10/31/2013 22:51   Ct Head Wo Contrast  10/29/2013   CLINICAL DATA:  53 year old female with congestion, pressure, dizziness, lightheaded, altered mental status. Initial encounter.  EXAM: CT HEAD WITHOUT CONTRAST  CT MAXILLOFACIAL WITHOUT CONTRAST  TECHNIQUE: Multidetector CT imaging of the head and maxillofacial structures were performed using the standard protocol without intravenous contrast. Multiplanar CT image reconstructions of the maxillofacial structures were also generated.  COMPARISON:  None.  FINDINGS: CT HEAD FINDINGS  Mastoids are clear.  No scalp hematoma.  Calvarium intact.  Cerebral volume is normal. Mild patchy cerebral white matter hypodensity, mostly periatrial in location. No suspicious intracranial vascular hyperdensity. No midline shift, ventriculomegaly, mass effect, evidence of mass lesion, intracranial hemorrhage or evidence of cortically based acute infarction. Gray-white matter differentiation is within normal limits throughout the brain.  CT MAXILLOFACIAL FINDINGS  Dysconjugate gaze. Otherwise orbits soft tissues appear within normal limits.  Mandible intact. Only minimal paranasal sinus mucosal thickening. Tympanic cavities are clear. Petrous apex air cells are clear, more numerous on the left. No acute facial bone fracture. Rightward superior and leftward inferior nasal septal deviation.  Visualized nasopharynx, oropharynx,  hypopharynx, larynx, sublingual space, submandibular glands, parotid glands, parapharyngeal spaces and retropharyngeal space  are within normal limits.  Visible cervical spine remarkable for chronic disc and endplate degeneration at C4-C5.  IMPRESSION: 1. No acute intracranial abnormality. Mild for age nonspecific white matter changes. 2. Minimal paranasal sinus mucosal thickening. No acute findings identified in the face.   Electronically Signed   By: Lars Pinks M.D.   On: 10/29/2013 21:11   Mr Brain Wo Contrast  11/01/2013   CLINICAL DATA:  Altered mental status, headache, weakness  EXAM: MRI HEAD WITHOUT CONTRAST  TECHNIQUE: Multiplanar, multiecho pulse sequences of the brain and surrounding structures were obtained without intravenous contrast.  COMPARISON:  Prior CT from 10/31/2013  FINDINGS: Study is degraded by motion artifact.  Multiple foci of abnormal restricted diffusion are seen involving the periventricular and deep white matter of the centrum semi ovales bilaterally. Corresponding signal drop out seen on the [facility] map. For reference purposes, the largest of these on the left measures approximately 9 mm and is located in the left frontal lobe (series 10, image 38). The largest of these lesions on the right measures 6 mm and is located in the right frontal lobe (series 10, image 37). These are localized predominantly within the frontal lobes along the junction of the superficial and deep perforator vessels. There is question of a small focus of restricted diffusion within the left temporal lobe (series 10, image 33). No infratentorial infarcts identified. No intracranial hemorrhage is seen.  Scattered and confluent T2/FLAIR hyperintensity within the periventricular white matter most compatible with mild chronic microvascular ischemic disease.  No mass lesion, mass effect, or midline shift. Ventricles are normal in size without evidence of hydrocephalus. No extra-axial fluid collection.  Calvarium is intact. Craniocervical junction is not well evaluated. Pituitary gland and orbits are not well evaluated as well.  IMPRESSION:  Multi focal subcentimeter ischemic infarcts involving the periventricular and deep white matter of both cerebral hemispheres as above. These foci are largely in a watershed distribution, and may be related to underlying hypotension or prior hypotensive episode. No definite signal changes involving the temporal lobes bilaterally to suggest herpes encephalitis, although evaluation is limited due to extensive motion artifact.   Electronically Signed   By: Jeannine Boga M.D.   On: 11/01/2013 21:32   US Carotid Duplex Bilateral  11/04/2013   CLINICAL DATA:  Stroke  EXAM: BILATERAL CAROTID DUPLEX ULTRASOUND  TECHNIQUE: Pearline Cables scale imaging, color Doppler and duplex ultrasound were performed of bilateral carotid and vertebral arteries in the neck.  COMPARISON:  Recent brain MRI 11/01/2013  FINDINGS: Technically challenging examination secondary to patient body habitus and irregular breathing/snoring.  Criteria: Quantification of carotid stenosis is based on velocity parameters that correlate the residual internal carotid diameter with NASCET-based stenosis levels, using the diameter of the distal internal carotid lumen as the denominator for stenosis measurement.  The following velocity measurements were obtained:  RIGHT  ICA:  62/25 cm/sec  CCA:  99991111 cm/sec  SYSTOLIC ICA/CCA RATIO:  123456  DIASTOLIC ICA/CCA RATIO:  0000000  ECA:  76 cm/sec  LEFT  ICA:  93/36 cm/sec  CCA:  99991111 cm/sec  SYSTOLIC ICA/CCA RATIO:  1.1  DIASTOLIC ICA/CCA RATIO:  1.6  ECA:  64 cm/sec  RIGHT CAROTID ARTERY: No significant atherosclerotic plaque or evidence of stenosis.  RIGHT VERTEBRAL ARTERY:  Patent with normal antegrade flow.  LEFT CAROTID ARTERY: No significant atherosclerotic plaque or evidence of stenosis.  LEFT VERTEBRAL ARTERY:  Patent with normal  antegrade flow.  IMPRESSION: Unremarkable carotid ultrasound.   Electronically Signed   By: Jacqulynn Cadet M.D.   On: 11/04/2013 14:58   Dg Chest Portable 1 View  10/31/2013    CLINICAL DATA:  Fever, hypotension.  EXAM: PORTABLE CHEST - 1 VIEW  COMPARISON:  09/18/2009  FINDINGS: Heart is borderline in size. Mild vascular congestion. No confluent opacities, effusions or edema. No acute bony abnormality.  IMPRESSION: Borderline heart size, mild vascular congestion   Electronically Signed   By: Rolm Baptise M.D.   On: 10/31/2013 16:39   Ct Maxillofacial Wo Cm  10/29/2013   CLINICAL DATA:  53 year old female with congestion, pressure, dizziness, lightheaded, altered mental status. Initial encounter.  EXAM: CT HEAD WITHOUT CONTRAST  CT MAXILLOFACIAL WITHOUT CONTRAST  TECHNIQUE: Multidetector CT imaging of the head and maxillofacial structures were performed using the standard protocol without intravenous contrast. Multiplanar CT image reconstructions of the maxillofacial structures were also generated.  COMPARISON:  None.  FINDINGS: CT HEAD FINDINGS  Mastoids are clear.  No scalp hematoma.  Calvarium intact.  Cerebral volume is normal. Mild patchy cerebral white matter hypodensity, mostly periatrial in location. No suspicious intracranial vascular hyperdensity. No midline shift, ventriculomegaly, mass effect, evidence of mass lesion, intracranial hemorrhage or evidence of cortically based acute infarction. Gray-white matter differentiation is within normal limits throughout the brain.  CT MAXILLOFACIAL FINDINGS  Dysconjugate gaze. Otherwise orbits soft tissues appear within normal limits.  Mandible intact. Only minimal paranasal sinus mucosal thickening. Tympanic cavities are clear. Petrous apex air cells are clear, more numerous on the left. No acute facial bone fracture. Rightward superior and leftward inferior nasal septal deviation.  Visualized nasopharynx, oropharynx, hypopharynx, larynx, sublingual space, submandibular glands, parotid glands, parapharyngeal spaces and retropharyngeal space are within normal limits.  Visible cervical spine remarkable for chronic disc and endplate  degeneration at C4-C5.  IMPRESSION: 1. No acute intracranial abnormality. Mild for age nonspecific white matter changes. 2. Minimal paranasal sinus mucosal thickening. No acute findings identified in the face.   Electronically Signed   By: Lars Pinks M.D.   On: 10/29/2013 21:11   Dg Fluoro Guide Lumbar Puncture  11/01/2013   CLINICAL DATA:  Headache, fever  EXAM: DIAGNOSTIC LUMBAR PUNCTURE UNDER FLUOROSCOPIC GUIDANCE  FLUOROSCOPY TIME:  0 min 36 seconds  PROCEDURE: Procedure, benefits, and risks were discussed with the patient, including alternatives.  Patient's questions were answered.  Written informed consent was obtained.  Timeout protocol followed.  Patient placed prone.  L4-L5 disc space was localized under fluoroscopy.  Skin prepped and draped in usual sterile fashion.  Skin and soft tissues anesthetized with 1.5 mL of 1% lidocaine.  22 gauge needle was advanced into the spinal canal where clear colorless CSF was encountered with an opening pressure of 3 cm H2O (prone).  9 mL of CSF was obtained in 4 tubes for requested analysis.  Procedure tolerated very well by patient without immediate complication.  IMPRESSION: Fluoroscopic guided lumbar puncture as above.   Electronically Signed   By: Lavonia Dana M.D.   On: 11/01/2013 15:13    Lab Results: Basic Metabolic Panel:  Recent Labs  11/08/13 0444  NA 149*  K 3.9  CL 114*  CO2 22  GLUCOSE 89  BUN 7  CREATININE 1.16*  CALCIUM 9.3   Liver Function Tests: No results found for this basename: AST, ALT, ALKPHOS, BILITOT, PROT, ALBUMIN,  in the last 72 hours   CBC:  Recent Labs  11/08/13 0444  WBC 5.6  HGB 12.3  HCT 37.8  MCV 83.8  PLT 361    Recent Results (from the past 240 hour(s))  CULTURE, BLOOD (ROUTINE X 2)     Status: None   Collection Time    10/31/13  4:08 PM      Result Value Ref Range Status   Specimen Description BLOOD LEFT ARM DRAWN BY RN LCT   Final   Special Requests BOTTLES DRAWN AEROBIC ONLY [place]   Final    Culture NO GROWTH 5 DAYS   Final   Report Status 11/05/2013 FINAL   Final  CULTURE, BLOOD (ROUTINE X 2)     Status: None   Collection Time    10/31/13  4:20 PM      Result Value Ref Range Status   Specimen Description BLOOD RIGHT HAND   Final   Special Requests BOTTLES DRAWN AEROBIC AND ANAEROBIC 8CC   Final   Culture NO GROWTH 5 DAYS   Final   Report Status 11/05/2013 FINAL   Final  MRSA PCR SCREENING     Status: None   Collection Time    11/01/13  3:00 AM      Result Value Ref Range Status   MRSA by PCR NEGATIVE  NEGATIVE Final   Comment:            The GeneXpert MRSA Assay (FDA     approved for NASAL specimens     only), is one component of a     comprehensive MRSA colonization     surveillance program. It is not     intended to diagnose MRSA     infection nor to guide or     monitor treatment for     MRSA infections.  CSF CULTURE     Status: None   Collection Time    11/01/13  2:25 PM      Result Value Ref Range Status   Specimen Description CSF   Final   Special Requests NONE   Final   Gram Stain     Final   Value: CYTOSPIN WBC PRESENT,BOTH PMN AND MONONUCLEAR     NO ORGANISMS SEEN     Performed at [facility]   Culture     Final   Value: NO GROWTH 3 DAYS     Performed at [facility]   Report Status 11/06/2013 FINAL   Final     Hospital Course: This is a 53 year old who came to the emergency department because of headaches altered mental status and fever. She appear to be at least somewhat septic on admission. She had acute renal failure on admission. She had lumbar puncture done which showed that she did have abnormalities suggesting a meningitis. She was started on Rocephin and vancomycin. After the results of the lumbar puncture came back telephone consultation was obtained with infection disease at [facility] it was recommended that she continue antibiotics for a total of 10 days. She improved over the next several days her headache  resolved her confusion resolved renal function was back to baseline. She had some trouble with hypertension and her antihypertensives were modified. She felt back to baseline at the time of  Discharge Exam: Blood pressure 150/80, pulse 81, temperature 97.6 F (36.4 C), temperature source Oral, resp. rate 20, height 5\' 3"  (1.6 m), weight 117.8 kg (259 lb 11.2 oz), last menstrual period 10/20/2013, SpO2 99.00%. She is awake and alert. Mental status is normal. Her chest is clear. Her heart is  regular.  Disposition: Home she will followup with her primary physician Dr. Legrand Rams in about a week. I did increase her antihypertensive and this will need to be reevaluated      Discharge Orders   Future Orders Complete By Expires   Discharge patient  As directed         Signed: Tristian Bouska L   11/09/2013, 9:04 AM

## 2013-11-09 NOTE — Progress Notes (Signed)
She feels well. She has no complaints. This is day 10 of IV antibiotics. She wants to go home. Her blood pressure is better controlled. She is asymptomatic from her presumed meningitis now.

## 2013-11-09 NOTE — Progress Notes (Signed)
UR chart review completed.  

## 2013-11-09 NOTE — Plan of Care (Signed)
Problem: Discharge Progression Outcomes Goal: Pain controlled with appropriate interventions Outcome: Completed/Met Date Met:  11/09/13 denies

## 2013-11-22 ENCOUNTER — Other Ambulatory Visit (HOSPITAL_COMMUNITY): Payer: Self-pay | Admitting: Internal Medicine

## 2013-11-22 DIAGNOSIS — N19 Unspecified kidney failure: Secondary | ICD-10-CM

## 2013-11-23 ENCOUNTER — Ambulatory Visit (HOSPITAL_COMMUNITY)
Admission: RE | Admit: 2013-11-23 | Discharge: 2013-11-23 | Disposition: A | Discharge status: Home / Self Care | Payer: BC Managed Care – PPO | Source: Ambulatory Visit | Attending: Internal Medicine | Admitting: Internal Medicine

## 2013-11-23 DIAGNOSIS — N19 Unspecified kidney failure: Secondary | ICD-10-CM | POA: Insufficient documentation

## 2014-07-25 ENCOUNTER — Encounter (HOSPITAL_COMMUNITY): Payer: Self-pay | Admitting: Emergency Medicine

## 2015-06-06 ENCOUNTER — Emergency Department (HOSPITAL_COMMUNITY): Payer: 59

## 2015-06-06 ENCOUNTER — Emergency Department (HOSPITAL_COMMUNITY)
Admission: EM | Admit: 2015-06-06 | Discharge: 2015-06-06 | Disposition: A | Discharge status: Home / Self Care | Payer: 59 | Attending: Emergency Medicine | Admitting: Emergency Medicine

## 2015-06-06 ENCOUNTER — Encounter (HOSPITAL_COMMUNITY): Payer: Self-pay

## 2015-06-06 DIAGNOSIS — I10 Essential (primary) hypertension: Secondary | ICD-10-CM | POA: Diagnosis not present

## 2015-06-06 DIAGNOSIS — S4991XA Unspecified injury of right shoulder and upper arm, initial encounter: Secondary | ICD-10-CM | POA: Diagnosis present

## 2015-06-06 DIAGNOSIS — X58XXXA Exposure to other specified factors, initial encounter: Secondary | ICD-10-CM | POA: Insufficient documentation

## 2015-06-06 DIAGNOSIS — Y9289 Other specified places as the place of occurrence of the external cause: Secondary | ICD-10-CM | POA: Diagnosis not present

## 2015-06-06 DIAGNOSIS — Y990 Civilian activity done for income or pay: Secondary | ICD-10-CM | POA: Insufficient documentation

## 2015-06-06 DIAGNOSIS — Z79899 Other long term (current) drug therapy: Secondary | ICD-10-CM | POA: Insufficient documentation

## 2015-06-06 DIAGNOSIS — M25511 Pain in right shoulder: Secondary | ICD-10-CM

## 2015-06-06 DIAGNOSIS — Y9389 Activity, other specified: Secondary | ICD-10-CM | POA: Insufficient documentation

## 2015-06-06 DIAGNOSIS — Z7951 Long term (current) use of inhaled steroids: Secondary | ICD-10-CM | POA: Insufficient documentation

## 2015-06-06 DIAGNOSIS — Z8639 Personal history of other endocrine, nutritional and metabolic disease: Secondary | ICD-10-CM | POA: Insufficient documentation

## 2015-06-06 HISTORY — DX: Pure hypercholesterolemia, unspecified: E78.00

## 2015-06-06 MED ORDER — IBUPROFEN 600 MG PO TABS: 600.0000 mg | ORAL_TABLET | Freq: Three times a day (TID) | ORAL | Status: DC | PRN

## 2015-06-06 NOTE — ED Provider Notes (Signed)
CSN: [id number]     Arrival date & time 06/06/15  3235 History  This chart was scribed for Jola Schmidt, MD by Hilda Lias, ED Scribe. This patient was seen in room APA05/APA05 and the patient's care was started at 9:08 AM.    Chief Complaint  Patient presents with  . Shoulder Pain      The history is provided by the patient. No language interpreter was used.    HPI Comments: Alison Elliott is a 54 y.o. female who presents to the Emergency Department complaining of constant right shoulder pain that has been present since yesterday when pt injured it at work. 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 Pt states that she was sitting down in the bathroom beside the elderly woman, and after the woman was finished, she stood up quickly while pt was still sitting down. Pt states she felt an immediate pain in her shoulder as she still had her right arm around the elderly woman. Pt states pain worsens with movement, and worsens in particular with movement of her arms over her head. Pt states she has been taking Ibuprofen with little relief. Pt states she does not have an orthopedist she normally sees.    Past Medical History  Diagnosis Date  . Hypertension   . Hypercholesterolemia    Past Surgical History  Procedure Laterality Date  . No past surgeries     Family History  Problem Relation Age of Onset  . Cancer Father     lung  . Emphysema Father   . Hypertension Mother   . Diabetes Mother   . Dementia Mother   . Arthritis Mother    Social History  Substance Use Topics  . Smoking status: Never Smoker   . Smokeless tobacco: None  . Alcohol Use: No   OB History    Gravida Para Term Preterm AB TAB SAB Ectopic Multiple Living   1 1             Review of Systems  A complete 10 system review of systems was obtained and all systems are negative except as  noted in the HPI and PMH.    Allergies  Review of patient's allergies indicates no known allergies.  Home Medications   Prior to Admission medications   Medication Sig Start Date End Date Taking? Authorizing Provider  DM-APAP-CPM (CORICIDIN HBP FLU) 15-500-2 MG TABS Take 2 tablets by mouth every 6 (six) hours as needed. Cold/flu    Historical Provider, MD  fluticasone (FLONASE) 50 MCG/ACT nasal spray Place 1 spray into both nostrils 2 (two) times daily.    Historical Provider, MD  HYDROcodone-acetaminophen (NORCO/VICODIN) 5-325 MG per tablet Take 1-2 tablets by mouth every 6 (six) hours as needed for moderate pain. 10/29/13   Fredia Sorrow, MD  lisinopril-hydrochlorothiazide (ZESTORETIC) 20-12.5 MG per tablet Take 1 tablet by mouth daily. 11/09/13   Sinda Du, MD  ondansetron (ZOFRAN ODT) 4 MG disintegrating tablet Take 1 tablet (4 mg total) by mouth every 8 (eight) hours as needed. 10/29/13   Fredia Sorrow, MD   BP 155/105 mmHg  Pulse 77  Temp(Src) 98.4 F (36.9 C) (Oral)  Resp 18  Ht 5\' 4"  (1.626 m)  SpO2 100%  LMP 10/20/2013 Physical Exam  Constitutional: She is oriented to person, place, and time. She appears well-developed and well-nourished.  HENT:  Head: Normocephalic.  Eyes: EOM are normal.  Neck: Normal range of motion.  Pulmonary/Chest: Effort normal.  Abdominal: She exhibits no distension.  Musculoskeletal: Normal range of motion.  Normal ROM of right wrist and elbow. Normal right radial pulse. Limited ROM of right shoulder past 90 degrees of abduction secondary to pain. No swelling or deformity of right shoulder  Neurological: She is alert and oriented to person, place, and time.  Psychiatric: She has a normal mood and affect.  Nursing note and vitals reviewed.   ED Course  Procedures (including critical care time)  DIAGNOSTIC STUDIES: Oxygen Saturation is 100% on room air, normal by my interpretation.    COORDINATION OF CARE: 9:15 AM Discussed treatment  plan with pt at bedside and pt agreed to plan.   Labs Review Labs Reviewed - No data to display  Imaging Review Right shoulder xray I have personally reviewed and evaluated these images as part of my medical decision-making.   EKG Interpretation None      MDM   Final diagnoses:  None    Orthopedic follow up. Sling and NSAIDs for comfort  I personally performed the services described in this documentation, which was scribed in my presence. The recorded information has been reviewed and is accurate.      Jola Schmidt, MD 06/06/15 [phone]

## 2015-06-06 NOTE — ED Notes (Addendum)
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

## 2015-06-27 ENCOUNTER — Ambulatory Visit (INDEPENDENT_AMBULATORY_CARE_PROVIDER_SITE_OTHER): Payer: 59 | Admitting: Orthopedic Surgery

## 2015-06-27 VITALS — BP 134/92 | Ht 64.0 in | Wt 225.0 lb

## 2015-06-27 DIAGNOSIS — M7551 Bursitis of right shoulder: Secondary | ICD-10-CM | POA: Diagnosis not present

## 2015-06-27 NOTE — Progress Notes (Signed)
Subjective:   Chief Complaint  Patient presents with  . Shoulder Pain    Right shoulder pain, no injurty. Referred by Dr. Legrand Rams.    54 year old female presents with atraumatic onset of right shoulder pain after reaching in the closet and hearing a click or pop. She went to the ER had x-rays which were normal and she was started on tramadol and used to muscle cramping. Symptoms present for 2 weeks initial symptoms were severe and she had loss of motion but her symptoms have all but resolved and her motion has returned to normal.  She denies any trauma  Review of systems is negative  Outside reports reviewed: ER records and imaging reports: normal joint x-ray.  The following portions of the patient's history were reviewed and updated as appropriate: allergies, current medications, past family history, past medical history, past social history, past surgical history and problem list.    Objective:    Blood pressure 134/92, height 5\' 4"  (1.626 m), weight 225 lb (102.059 kg), last menstrual period 10/20/2013. Gen. appearance is normal The patient is alert and oriented person place and time Mood is normal affect is normal Ambulatory status normal  Right SHOULDER PALPATION no tenderness or swelling ROM slight limitations in internal rotation and terminal flexion with mild discomfort.     Neer:   negative  Hawkins  negative  Stability:   normal  Apprehension Sign:   negative/negative  Atrophy:   none noted  Strength:  biceps 5/5, triceps 5/5, abduction 5/5, adduction 5/5   The opposite shoulder was normal   SKIN overlying the right shoulder showed no abrasions lacerations or ecchymosis CV radial and ulnar pulses intact capillary refill normal LYMPH axillary and supraclavicular lymph nodes were normal SENSATION grossly sensation normal in the right and left upper extremities DTR 2+ equal COORDINATION normal  CSPINE EVAL: No tenderness Imaging X-Ray: outside films reviewed  AC  Joint:no abnormalities noted  Glenohumeral joint: no abnormalities noted  Elevation humeral head: absent  Cuff arthropathy: absent    Assessment:    right shoulder bursitis    Plan:    Continue with existing conservative treatment program Home exercises for 6 weeks  Patient requested to return prn

## 2015-06-27 NOTE — Patient Instructions (Signed)
Home exercises for six weeks

## 2016-06-04 ENCOUNTER — Other Ambulatory Visit (HOSPITAL_COMMUNITY): Payer: Self-pay | Admitting: Internal Medicine

## 2016-06-04 DIAGNOSIS — Z1231 Encounter for screening mammogram for malignant neoplasm of breast: Secondary | ICD-10-CM

## 2016-07-08 ENCOUNTER — Encounter: Payer: Self-pay | Admitting: *Deleted

## 2016-07-10 ENCOUNTER — Other Ambulatory Visit (HOSPITAL_COMMUNITY)
Admission: RE | Admit: 2016-07-10 | Discharge: 2016-07-10 | Disposition: A | Discharge status: Home / Self Care | Payer: BLUE CROSS/BLUE SHIELD | Source: Ambulatory Visit | Attending: Obstetrics and Gynecology | Admitting: Obstetrics and Gynecology

## 2016-07-10 ENCOUNTER — Ambulatory Visit (INDEPENDENT_AMBULATORY_CARE_PROVIDER_SITE_OTHER): Payer: BLUE CROSS/BLUE SHIELD | Admitting: Obstetrics and Gynecology

## 2016-07-10 ENCOUNTER — Ambulatory Visit (HOSPITAL_COMMUNITY)
Admission: RE | Admit: 2016-07-10 | Discharge: 2016-07-10 | Disposition: A | Discharge status: Home / Self Care | Payer: BLUE CROSS/BLUE SHIELD | Source: Ambulatory Visit | Attending: Internal Medicine | Admitting: Internal Medicine

## 2016-07-10 ENCOUNTER — Encounter: Payer: Self-pay | Admitting: Obstetrics and Gynecology

## 2016-07-10 VITALS — BP 120/72 | HR 90 | Ht 62.0 in | Wt 228.0 lb

## 2016-07-10 DIAGNOSIS — Z01419 Encounter for gynecological examination (general) (routine) without abnormal findings: Secondary | ICD-10-CM | POA: Insufficient documentation

## 2016-07-10 DIAGNOSIS — Z1151 Encounter for screening for human papillomavirus (HPV): Secondary | ICD-10-CM | POA: Insufficient documentation

## 2016-07-10 DIAGNOSIS — Z1231 Encounter for screening mammogram for malignant neoplasm of breast: Secondary | ICD-10-CM | POA: Insufficient documentation

## 2016-07-10 NOTE — Progress Notes (Signed)
Patient ID: Alison Elliott, female   DOB: [birth year], 55 y.o.   MRN: [id number]  Assessment:  Annual Gyn Exam Discussion about healthy eating habits and lifestyle changes.    Plan:  1. pap smear done, next pap due in 3 years  2. return in 3 years or prn 3    Annual mammogram advised  Subjective:   Chief Complaint  Patient presents with  . Gynecologic Exam     Alison Elliott is a 55 y.o. female G1P1 who presents for annual exam. Patient's last menstrual period was 10/20/2013. The patient has no complaints today. She states she walks 3-8 miles daily. She also reports she has a stationary bike at home. She denies difficulty with urination, defecation or with vasomotor symptoms.   PCP- Dr. Legrand Rams   The following portions of the patient's history were reviewed and updated as appropriate: allergies, current medications, past family history, past medical history, past social history, past surgical history and problem list. Past Medical History:  Diagnosis Date  . Hypercholesterolemia   . Hypertension   . Morbid obesity due to excess calories ([place])   . Osteoarthritis     Past Surgical History:  Procedure Laterality Date  . NO PAST SURGERIES       Current Outpatient Prescriptions:  .  lisinopril (PRINIVIL,ZESTRIL) 10 MG tablet, Take 10 mg by mouth daily., Disp: , Rfl:  .  rosuvastatin (CRESTOR) 20 MG tablet, Take 20 mg by mouth daily., Disp: , Rfl:   Review of Systems Constitutional: negative Gastrointestinal: negative Genitourinary: negative   Objective:  BP 120/72   Pulse 90   Ht 5\' 2"  (1.575 m)   Wt 228 lb (103.4 kg)   LMP 10/20/2013   BMI 41.70 kg/m    BMI: Body mass index is 41.7 kg/m.  General Appearance: Alert, appropriate appearance for age. No acute distress HEENT: Grossly normal Neck / Thyroid:  Cardiovascular: RRR; normal S1, S2, no murmur Lungs: CTA bilaterally Back: No CVAT Breast Exam: No masses or nodes.No dimpling, nipple retraction or  discharge. Gastrointestinal: Soft, non-tender, no masses or organomegaly Pelvic Exam:  External genitalia: normal general appearance Vaginal: normal mucosa without prolapse or lesions, good support  Cervix: normal appearance, well supported Adnexa: normal bimanual exam Uterus: normal single, nontender, small, anterior  Rectovaginal: normal rectal, no masses Lymphatic Exam: Non-palpable nodes in neck, clavicular, axillary, or inguinal regions  Skin: no rash or abnormalities Neurologic: Normal gait and speech, no tremor  Psychiatric: Alert and oriented, appropriate affect.  Urinalysis:Not done  Guaiac negative   Mallory Shirk. MD Pgr [phone] 1:33 PM    By signing my name below, I, Hansel Feinstein, attest that this documentation has been prepared under the direction and in the presence of Jonnie Kind, MD. Electronically Signed: Hansel Feinstein, ED Scribe. 07/10/16. 1:33 PM.  I personally performed the services described in this documentation, which was SCRIBED in my presence. The recorded information has been reviewed and considered accurate. It has been edited as necessary during review. Jonnie Kind, MD

## 2016-07-12 LAB — CYTOLOGY - PAP
DIAGNOSIS: NEGATIVE
HPV: NOT DETECTED

## 2017-06-05 ENCOUNTER — Other Ambulatory Visit (HOSPITAL_COMMUNITY): Payer: Self-pay | Admitting: Internal Medicine

## 2017-06-05 DIAGNOSIS — Z1231 Encounter for screening mammogram for malignant neoplasm of breast: Secondary | ICD-10-CM

## 2017-07-18 ENCOUNTER — Ambulatory Visit (HOSPITAL_COMMUNITY)
Admission: RE | Admit: 2017-07-18 | Discharge: 2017-07-18 | Disposition: A | Discharge status: Home / Self Care | Payer: BLUE CROSS/BLUE SHIELD | Source: Ambulatory Visit | Attending: Internal Medicine | Admitting: Internal Medicine

## 2017-07-18 DIAGNOSIS — Z1231 Encounter for screening mammogram for malignant neoplasm of breast: Secondary | ICD-10-CM | POA: Diagnosis present

## 2017-07-29 ENCOUNTER — Encounter: Payer: Self-pay | Admitting: General Practice

## 2018-07-16 ENCOUNTER — Other Ambulatory Visit (HOSPITAL_COMMUNITY): Payer: Self-pay | Admitting: Internal Medicine

## 2018-07-16 DIAGNOSIS — R7989 Other specified abnormal findings of blood chemistry: Secondary | ICD-10-CM

## 2018-07-16 DIAGNOSIS — R945 Abnormal results of liver function studies: Secondary | ICD-10-CM

## 2018-07-20 ENCOUNTER — Other Ambulatory Visit (HOSPITAL_COMMUNITY): Payer: Self-pay | Admitting: Internal Medicine

## 2018-07-20 DIAGNOSIS — Z1231 Encounter for screening mammogram for malignant neoplasm of breast: Secondary | ICD-10-CM

## 2018-07-23 ENCOUNTER — Ambulatory Visit (HOSPITAL_COMMUNITY)
Admission: RE | Admit: 2018-07-23 | Discharge: 2018-07-23 | Disposition: A | Discharge status: Home / Self Care | Payer: BLUE CROSS/BLUE SHIELD | Source: Ambulatory Visit | Attending: Internal Medicine | Admitting: Internal Medicine

## 2018-07-23 DIAGNOSIS — R945 Abnormal results of liver function studies: Secondary | ICD-10-CM | POA: Diagnosis present

## 2018-07-23 DIAGNOSIS — R7989 Other specified abnormal findings of blood chemistry: Secondary | ICD-10-CM

## 2018-07-23 DIAGNOSIS — R932 Abnormal findings on diagnostic imaging of liver and biliary tract: Secondary | ICD-10-CM | POA: Diagnosis not present

## 2018-07-30 ENCOUNTER — Ambulatory Visit (HOSPITAL_COMMUNITY)
Admission: RE | Admit: 2018-07-30 | Discharge: 2018-07-30 | Disposition: A | Discharge status: Home / Self Care | Payer: BLUE CROSS/BLUE SHIELD | Source: Ambulatory Visit | Attending: Internal Medicine | Admitting: Internal Medicine

## 2018-07-30 DIAGNOSIS — Z1231 Encounter for screening mammogram for malignant neoplasm of breast: Secondary | ICD-10-CM | POA: Insufficient documentation

## 2019-06-22 ENCOUNTER — Other Ambulatory Visit (HOSPITAL_COMMUNITY): Payer: Self-pay | Admitting: Internal Medicine

## 2019-06-22 DIAGNOSIS — Z1231 Encounter for screening mammogram for malignant neoplasm of breast: Secondary | ICD-10-CM

## 2019-06-29 ENCOUNTER — Telehealth: Payer: Self-pay | Admitting: Obstetrics and Gynecology

## 2019-06-29 NOTE — Telephone Encounter (Signed)

## 2019-06-30 ENCOUNTER — Ambulatory Visit: Payer: BLUE CROSS/BLUE SHIELD | Admitting: Obstetrics and Gynecology

## 2019-06-30 ENCOUNTER — Other Ambulatory Visit: Payer: Self-pay

## 2019-06-30 ENCOUNTER — Encounter: Payer: Self-pay | Admitting: Obstetrics and Gynecology

## 2019-06-30 DIAGNOSIS — N950 Postmenopausal bleeding: Secondary | ICD-10-CM | POA: Diagnosis not present

## 2019-06-30 NOTE — Progress Notes (Signed)
Alison Elliott is a 58 yo G1P1 with episode of PMB about 2 weeks ago. Bleeding for 2-3 days First day heavy requiring a pad. Occ hot flash Not sexual active Last pap 2017 no H/O abnormal Mammogram last yr, nl  HTN and hypercholesteroemia, followed by PCP  No FH of GYN CA  Denies habits  TSVD x 1  No HA, CP, SOB bowel or bladder dysfunction  PE AF VSS Lungs clear Heart RRR Abd soft + Bs   A/P PMB Reviewed with pt. Will check GYN U/S. F/U aoot for results, pap smear and +/- EMBX

## 2019-06-30 NOTE — Patient Instructions (Signed)
Postmenopausal Bleeding  Postmenopausal bleeding is any bleeding that occurs after menopause. Menopause is when a woman's period stops. Any type of bleeding after menopause should be checked by your doctor. Treatment will depend on the cause. Follow these instructions at home:  Pay attention to any changes in your symptoms.  Avoid using tampons and douches as told by your doctor.  Change your pads regularly.  Get regular pelvic exams and Pap tests.  Take iron pills as told by your doctor.  Take over-the-counter and prescription medicines only as told by your doctor.  Keep all follow-up visits as told by your doctor. This is important. Contact a doctor if:  Your bleeding lasts for more than 1 week.  You have pain in your belly (abdomen).  You have bleeding during or after sex.  You have bleeding that happens more often than every 3 weeks. Get help right away if:  You have fever, chills, headache, dizziness, muscle aches, or bleeding.  You have very bad pain with bleeding.  You have clumps of blood (blood clots) coming from your vagina.  You have a lot of bleeding, and: ? You use more than 1 pad an hour. ? This kind of bleeding has never happened before.  You feel like you are going to pass out (faint). Summary  Any type of bleeding after menopause should be checked by your doctor.  Pay attention to any changes in your symptoms.  Keep all follow-up visits as told by your doctor. This information is not intended to replace advice given to you by your health care provider. Make sure you discuss any questions you have with your health care provider. Document Released: 06/18/2008 Document Revised: 11/26/2018 Document Reviewed: 10/15/2016 Elsevier Patient Education  2020 [employer]  

## 2019-07-02 ENCOUNTER — Telehealth: Payer: Self-pay | Admitting: Obstetrics & Gynecology

## 2019-07-02 ENCOUNTER — Other Ambulatory Visit: Payer: Self-pay | Admitting: Obstetrics and Gynecology

## 2019-07-02 DIAGNOSIS — N950 Postmenopausal bleeding: Secondary | ICD-10-CM

## 2019-07-02 NOTE — Telephone Encounter (Signed)

## 2019-07-05 ENCOUNTER — Ambulatory Visit (INDEPENDENT_AMBULATORY_CARE_PROVIDER_SITE_OTHER): Payer: BLUE CROSS/BLUE SHIELD

## 2019-07-05 ENCOUNTER — Other Ambulatory Visit: Payer: Self-pay

## 2019-07-05 DIAGNOSIS — N950 Postmenopausal bleeding: Secondary | ICD-10-CM

## 2019-07-05 NOTE — Progress Notes (Signed)
PELVIC US TA/TV:heterogeneous anteverted uterus with and anterior subserosal calcified fibroid 2.3 x 2 x 2.2 cm,thickened homogeneous endometrium 10.7 mm,normal ovaries bilat,ovaries appear mobile,no pain during ultrasound,no free fluid  Chaperone: Estill Bamberg

## 2019-07-29 ENCOUNTER — Telehealth: Payer: Self-pay | Admitting: Obstetrics and Gynecology

## 2019-07-29 NOTE — Telephone Encounter (Signed)

## 2019-07-30 ENCOUNTER — Encounter: Payer: Self-pay | Admitting: Obstetrics and Gynecology

## 2019-07-30 ENCOUNTER — Ambulatory Visit (INDEPENDENT_AMBULATORY_CARE_PROVIDER_SITE_OTHER): Payer: BLUE CROSS/BLUE SHIELD | Admitting: Obstetrics and Gynecology

## 2019-07-30 ENCOUNTER — Other Ambulatory Visit: Payer: Self-pay

## 2019-07-30 ENCOUNTER — Inpatient Hospital Stay (HOSPITAL_COMMUNITY): Admit: 2019-07-30 | Payer: BLUE CROSS/BLUE SHIELD

## 2019-07-30 VITALS — BP 136/87 | HR 74 | Ht 63.0 in | Wt 237.8 lb

## 2019-07-30 DIAGNOSIS — N950 Postmenopausal bleeding: Secondary | ICD-10-CM

## 2019-07-30 DIAGNOSIS — Z01419 Encounter for gynecological examination (general) (routine) without abnormal findings: Secondary | ICD-10-CM

## 2019-07-30 DIAGNOSIS — R9389 Abnormal findings on diagnostic imaging of other specified body structures: Secondary | ICD-10-CM

## 2019-07-30 NOTE — Progress Notes (Signed)
Alison Elliott presents for pap smear and EMBX d/t PMB Pt continues to have some degree of daily bleeding U/S small fibroid and thicken endometrium.  ENDOMETRIAL BIOPSY     The indications for endometrial biopsy were reviewed.   Risks of the biopsy including cramping, bleeding, infection, uterine perforation, inadequate specimen and need for additional procedures  were discussed. The patient states she understands and agrees to undergo procedure today. Consent was signed. Time out was performed.  During the pelvic exam, the cervix was prepped with Betadine. A single-toothed tenaculum was placed on the anterior lip of the cervix to stabilize it. The 3 mm pipelle was introduced into the endometrial cavity without difficulty to a depth of 10cm, and a moderate amount of tissue was obtained and sent to pathology. The instruments were removed from the patient's vagina. Minimal bleeding from the cervix was noted. The patient tolerated the procedure well. Routine post-procedure instructions were given to the patient.    A/P PMB Pap smear and EMBX completed. F/U per results.

## 2019-07-30 NOTE — Addendum Note (Signed)
Addended by: Christiana Pellant A on: 07/30/2019 08:59 AM   Modules accepted: Orders

## 2019-07-30 NOTE — Patient Instructions (Signed)

## 2019-08-02 ENCOUNTER — Ambulatory Visit (HOSPITAL_COMMUNITY)
Admission: RE | Admit: 2019-08-02 | Discharge: 2019-08-02 | Disposition: A | Discharge status: Home / Self Care | Payer: BLUE CROSS/BLUE SHIELD | Source: Ambulatory Visit | Attending: Internal Medicine | Admitting: Internal Medicine

## 2019-08-02 ENCOUNTER — Other Ambulatory Visit: Payer: Self-pay

## 2019-08-02 DIAGNOSIS — Z1231 Encounter for screening mammogram for malignant neoplasm of breast: Secondary | ICD-10-CM | POA: Insufficient documentation

## 2019-08-02 LAB — CYTOLOGY - PAP
Comment: NEGATIVE
Diagnosis: NEGATIVE
High risk HPV: NEGATIVE

## 2019-08-03 ENCOUNTER — Telehealth: Payer: Self-pay | Admitting: *Deleted

## 2019-08-03 NOTE — Telephone Encounter (Signed)
Patient informed pap smear normal. Verbalized understanding.

## 2019-08-09 ENCOUNTER — Telehealth: Payer: Self-pay | Admitting: *Deleted

## 2019-08-09 MED ORDER — MEGESTROL ACETATE 40 MG PO TABS: 40.0000 mg | ORAL_TABLET | Freq: Every day | ORAL | 1 refills | Status: AC

## 2019-08-09 NOTE — Telephone Encounter (Signed)
Patient informed per Dr Rip Harbour, [facility] was normal and he recommend starting Megace 40 mg po qd and f/u appt with him in 6-8 weeks. Pt verbalized understanding with no further questions and appt made.

## 2019-08-10 ENCOUNTER — Telehealth: Payer: Self-pay | Admitting: *Deleted

## 2019-08-10 NOTE — Telephone Encounter (Signed)
Patient called with questions and concerns regarding recent medication prescribed.

## 2019-08-10 NOTE — Telephone Encounter (Signed)
When I called pt, pt wanted a sooner appt and has already scheduled an appt for Thursday. Encounter closed. Aten

## 2019-08-11 ENCOUNTER — Telehealth: Payer: Self-pay | Admitting: Adult Health

## 2019-08-11 NOTE — Telephone Encounter (Signed)

## 2019-08-12 ENCOUNTER — Encounter: Payer: Self-pay | Admitting: Adult Health

## 2019-08-12 ENCOUNTER — Other Ambulatory Visit: Payer: Self-pay

## 2019-08-12 ENCOUNTER — Ambulatory Visit (INDEPENDENT_AMBULATORY_CARE_PROVIDER_SITE_OTHER): Payer: BLUE CROSS/BLUE SHIELD | Admitting: Adult Health

## 2019-08-12 VITALS — BP 132/79 | HR 77 | Ht 64.0 in | Wt 240.5 lb

## 2019-08-12 DIAGNOSIS — D219 Benign neoplasm of connective and other soft tissue, unspecified: Secondary | ICD-10-CM | POA: Diagnosis not present

## 2019-08-12 DIAGNOSIS — N950 Postmenopausal bleeding: Secondary | ICD-10-CM

## 2019-08-12 DIAGNOSIS — R9389 Abnormal findings on diagnostic imaging of other specified body structures: Secondary | ICD-10-CM | POA: Diagnosis not present

## 2019-08-12 NOTE — Progress Notes (Addendum)
  Subjective:     Patient ID: Alison Elliott, female   DOB: 1961-01-06, 58 y.o.   MRN: [id number]  HPI Alison Elliott is a 58 year old black female PM, with history of PMB and thickened endometrium who had endometrial biopsy by Dr Rip Harbour that showed polypoid atrophic endometrium, no malignancy.She stopped bleeding 08/05/19 and started megace this week but since not bleeding wants to know if she really needs to take, and she has questions about fibroids.  PCP is Dr Legrand Rams.  Review of Systems No bleeding since last Thursday  Reviewed past medical,surgical, social and family history. Reviewed medications and allergies.     Objective:   Physical Exam BP 132/79 (BP Location: Left Arm, Patient Position: Sitting, Cuff Size: Large)   Pulse 77   Ht 5\' 4"  (1.626 m)   Wt 240 lb 8 oz (109.1 kg)   LMP 10/20/2013   BMI 41.28 kg/m   Skin warm and dry. . Lungs: clear to ausculation bilaterally. Cardiovascular: regular rate and rhythm. Fall risk is low Reviewed [place] and pathology with her, and discussed with Dr Elonda Husky, will stop megace.    Assessment:     1. Postmenopausal bleeding   2. Endometrial thickening on ultrasound   3. Fibroid       Plan:     Stop megace, but save it, could restart if bleeding resumes  Follow up with Dr Rip Harbour in January  Review handout on fibroid by [employer]

## 2019-10-20 ENCOUNTER — Ambulatory Visit: Payer: BLUE CROSS/BLUE SHIELD | Admitting: Obstetrics and Gynecology

## 2019-10-20 ENCOUNTER — Ambulatory Visit: Payer: Self-pay | Admitting: Obstetrics and Gynecology

## 2021-08-03 IMAGING — MG DIGITAL SCREENING BILAT W/ TOMO W/ CAD
6 of 12 series · 6 of 36 positions shown · non-contrast
Comparison: Previous exam(s).

CLINICAL DATA: Screening.

EXAM:
DIGITAL SCREENING BILATERAL MAMMOGRAM WITH TOMO AND CAD

[R CC synth-2D (1 of 2)]
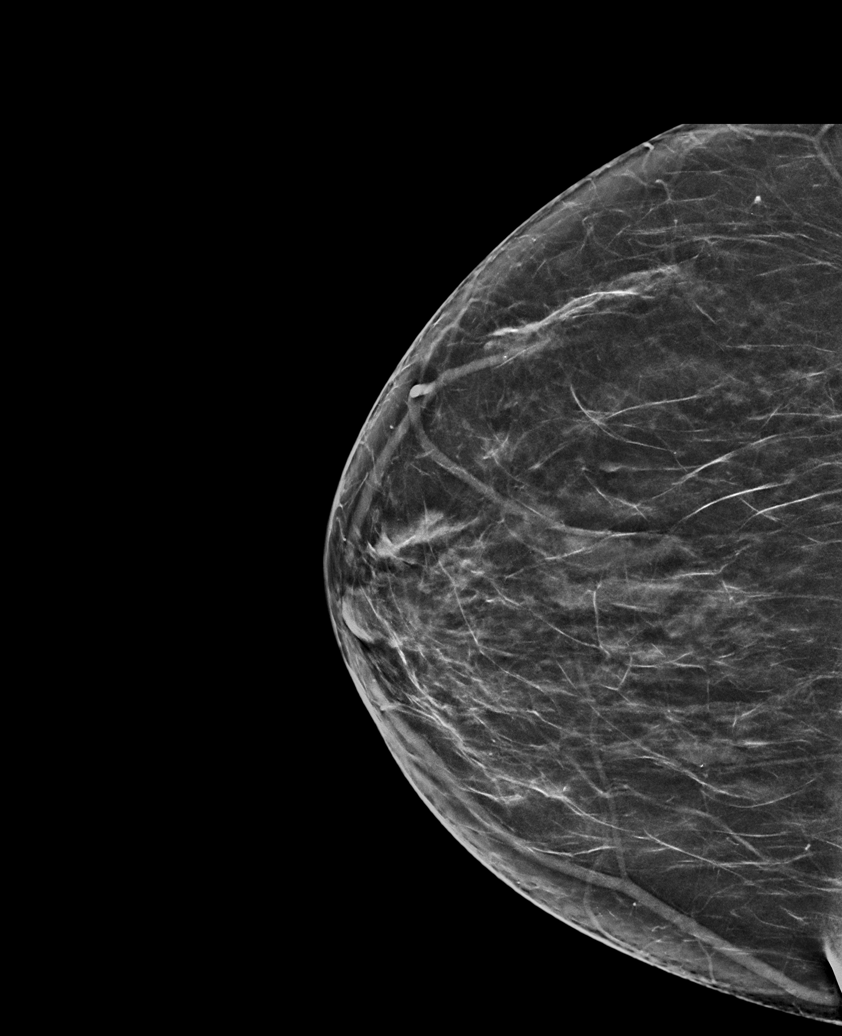

[R CC synth-2D (2 of 2)]
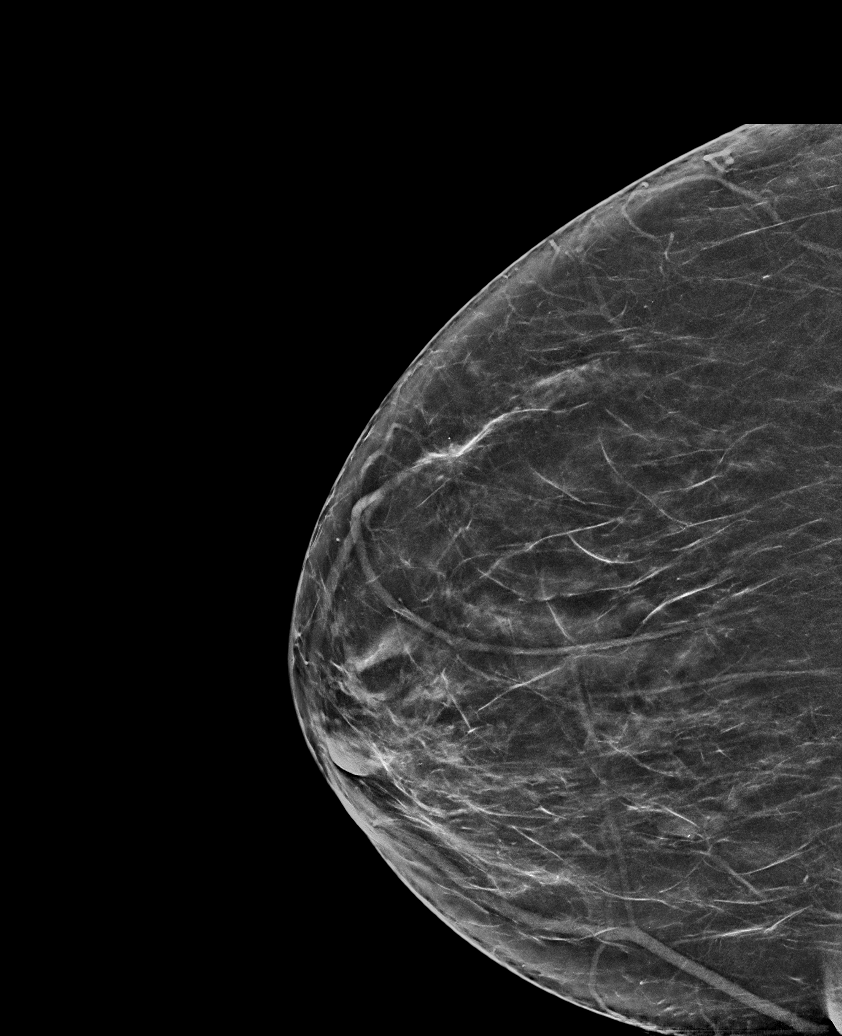

[L MLO synth-2D (1 of 2)]
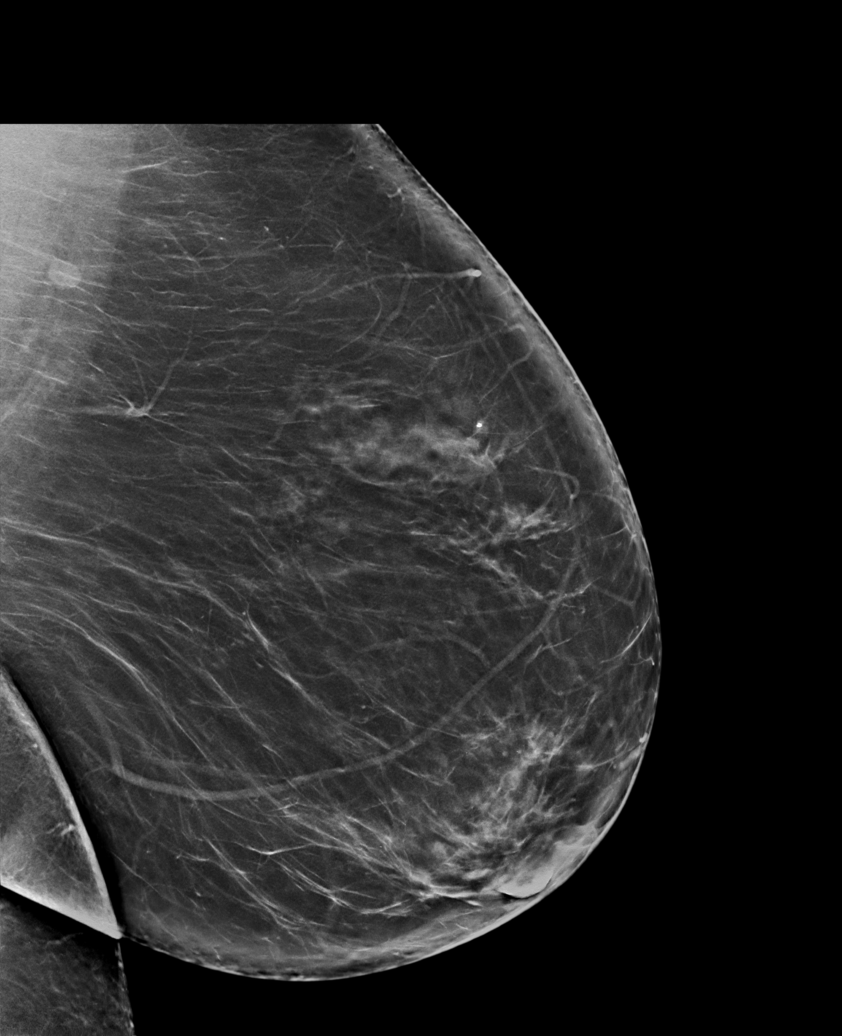

[R MLO synth-2D]
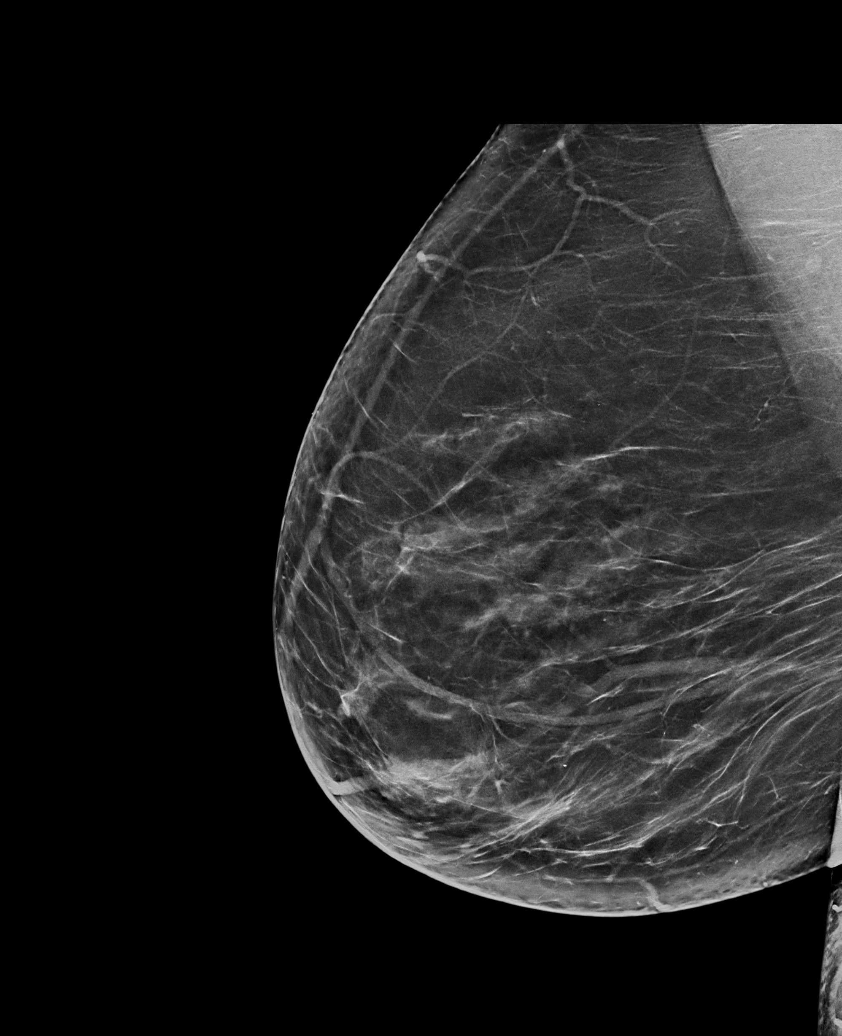

[L MLO synth-2D (2 of 2)]
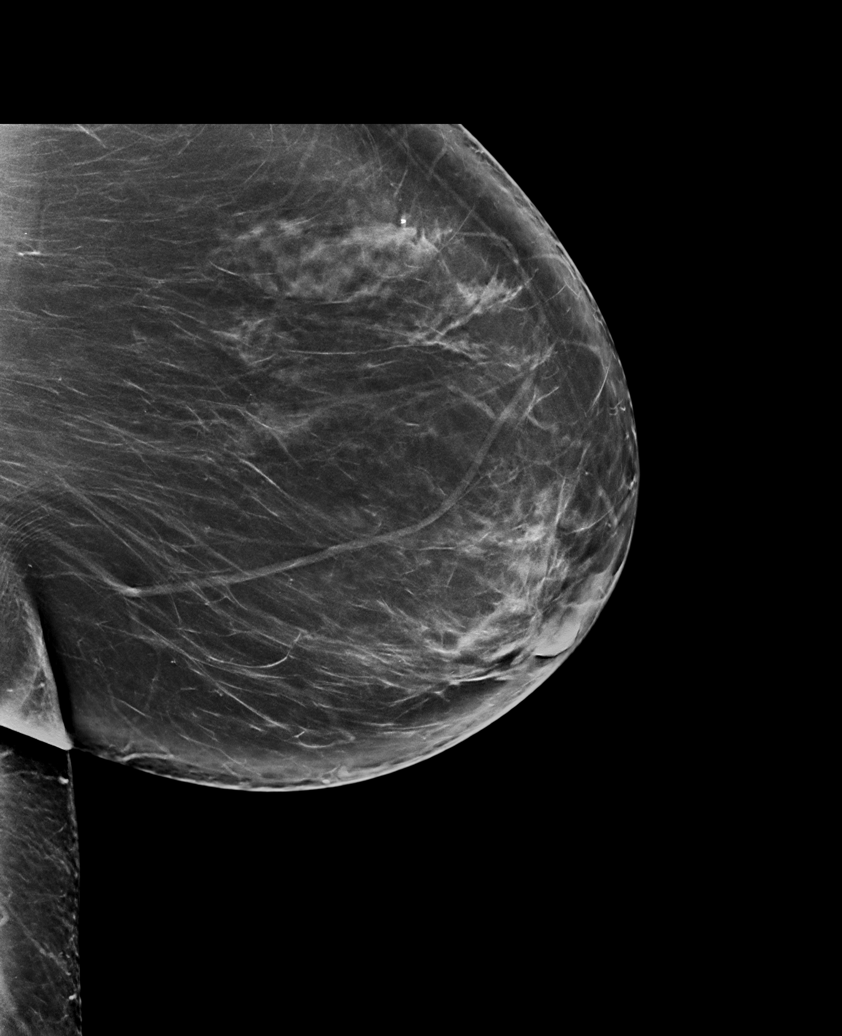

[L CC synth-2D]
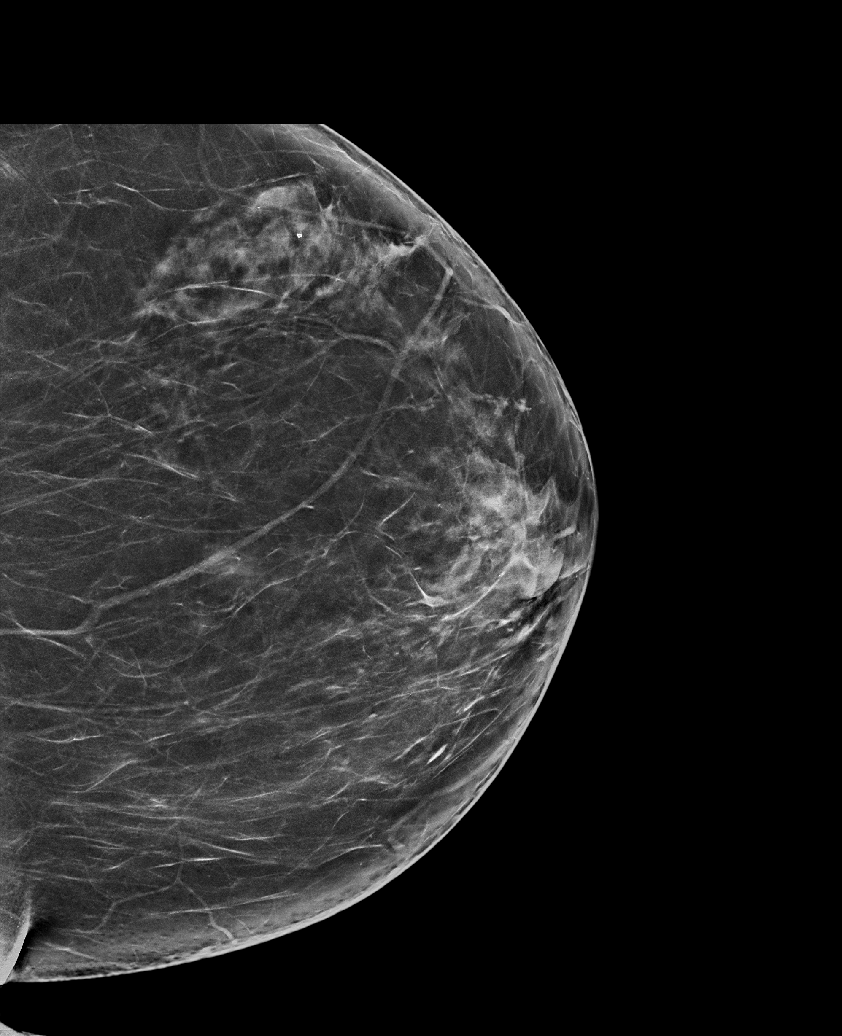

[6 of 36 positions shown; findings below may reference images not displayed]

ACR Breast Density Category b: There are scattered areas of
fibroglandular density.
FINDINGS: There are no findings suspicious for malignancy. Images were
processed with CAD.
IMPRESSION: No mammographic evidence of malignancy. A result letter of this
screening mammogram will be mailed directly to the patient.

RECOMMENDATION:
Screening mammogram in one year. (Code:[id number])

BI-RADS CATEGORY  1: Negative.
